# Patient Record
Sex: Female | Born: 1955 | Race: White | Hispanic: No | Marital: Married | Smoking: Never smoker
Health system: Southern US, Community
[De-identification: ages and names within clinical notes are randomized; demographics above are authoritative.]

## PROBLEM LIST (undated history)

## (undated) DIAGNOSIS — M1990 Unspecified osteoarthritis, unspecified site: Secondary | ICD-10-CM

## (undated) DIAGNOSIS — E785 Hyperlipidemia, unspecified: Secondary | ICD-10-CM

## (undated) DIAGNOSIS — J189 Pneumonia, unspecified organism: Secondary | ICD-10-CM

## (undated) DIAGNOSIS — K219 Gastro-esophageal reflux disease without esophagitis: Secondary | ICD-10-CM

## (undated) HISTORY — PX: REFRACTIVE SURGERY: SHX103

## (undated) HISTORY — PX: HAND SURGERY: SHX662

## (undated) HISTORY — PX: SLEEVE GASTROPLASTY: SHX1101

## (undated) HISTORY — PX: KNEE ARTHROSCOPY: SUR90

## (undated) HISTORY — PX: ABDOMINAL HYSTERECTOMY: SHX81

## (undated) HISTORY — PX: COLONOSCOPY: SHX174

## (undated) HISTORY — PX: TONSILLECTOMY: SUR1361

## (undated) HISTORY — PX: ESOPHAGOGASTRODUODENOSCOPY ENDOSCOPY: SHX5814

## (undated) HISTORY — PX: CHOLECYSTECTOMY: SHX55

---

## 2015-09-20 HISTORY — PX: REDUCTION MAMMAPLASTY: SUR839

## 2019-04-25 ENCOUNTER — Other Ambulatory Visit: Payer: Self-pay | Admitting: Family Medicine

## 2019-04-25 DIAGNOSIS — Z1231 Encounter for screening mammogram for malignant neoplasm of breast: Secondary | ICD-10-CM

## 2019-06-19 ENCOUNTER — Ambulatory Visit
Admission: RE | Admit: 2019-06-19 | Discharge: 2019-06-19 | Disposition: A | Discharge status: Home / Self Care | Payer: Federal, State, Local not specified - PPO | Source: Ambulatory Visit | Attending: Family Medicine | Admitting: Family Medicine

## 2019-06-19 ENCOUNTER — Other Ambulatory Visit: Payer: Self-pay

## 2019-06-19 DIAGNOSIS — Z1231 Encounter for screening mammogram for malignant neoplasm of breast: Secondary | ICD-10-CM

## 2020-01-16 ENCOUNTER — Emergency Department (HOSPITAL_COMMUNITY)
Admission: EM | Admit: 2020-01-16 | Discharge: 2020-01-16 | Disposition: A | Discharge status: Home / Self Care | Payer: Federal, State, Local not specified - PPO | Attending: Emergency Medicine | Admitting: Emergency Medicine

## 2020-01-16 ENCOUNTER — Other Ambulatory Visit: Payer: Self-pay

## 2020-01-16 ENCOUNTER — Emergency Department (HOSPITAL_COMMUNITY): Payer: Federal, State, Local not specified - PPO

## 2020-01-16 ENCOUNTER — Encounter (HOSPITAL_COMMUNITY): Payer: Self-pay | Admitting: Emergency Medicine

## 2020-01-16 DIAGNOSIS — R1031 Right lower quadrant pain: Secondary | ICD-10-CM | POA: Insufficient documentation

## 2020-01-16 DIAGNOSIS — R109 Unspecified abdominal pain: Secondary | ICD-10-CM

## 2020-01-16 LAB — COMPREHENSIVE METABOLIC PANEL
ALT: 38 U/L (ref 0–44)
AST: 35 U/L (ref 15–41)
Albumin: 4.2 g/dL (ref 3.5–5.0)
Alkaline Phosphatase: 59 U/L (ref 38–126)
Anion gap: 9 (ref 5–15)
BUN: 14 mg/dL (ref 8–23)
CO2: 25 mmol/L (ref 22–32)
Calcium: 9.2 mg/dL (ref 8.9–10.3)
Chloride: 105 mmol/L (ref 98–111)
Creatinine, Ser: 0.81 mg/dL (ref 0.44–1.00)
GFR calc Af Amer: >60 mL/min (ref >60)
GFR calc non Af Amer: >60 mL/min (ref >60)
Glucose, Bld: 94 mg/dL (ref 70–99)
Potassium: 3.7 mmol/L (ref 3.5–5.1)
Sodium: 139 mmol/L (ref 135–145)
Total Bilirubin: 0.4 mg/dL (ref 0.3–1.2)
Total Protein: 6.8 g/dL (ref 6.5–8.1)

## 2020-01-16 LAB — URINALYSIS, ROUTINE W REFLEX MICROSCOPIC
Bilirubin Urine: NEGATIVE
Glucose, UA: NEGATIVE mg/dL
Hgb urine dipstick: NEGATIVE
Ketones, ur: NEGATIVE mg/dL
Leukocytes,Ua: NEGATIVE
Nitrite: NEGATIVE
Protein, ur: NEGATIVE mg/dL
Specific Gravity, Urine: 1.019 (ref 1.005–1.030)
pH: 6 (ref 5.0–8.0)

## 2020-01-16 LAB — CBC WITH DIFFERENTIAL/PLATELET
Abs Immature Granulocytes: 0.04 10*3/uL (ref 0.00–0.07)
Basophils Absolute: 0.1 10*3/uL (ref 0.0–0.1)
Basophils Relative: 1 %
Eosinophils Absolute: 0.2 10*3/uL (ref 0.0–0.5)
Eosinophils Relative: 3 %
HCT: 41.7 % (ref 36.0–46.0)
Hemoglobin: 13.2 g/dL (ref 12.0–15.0)
Immature Granulocytes: 1 %
Lymphocytes Relative: 33 %
Lymphs Abs: 2.3 10*3/uL (ref 0.7–4.0)
MCH: 32.6 pg (ref 26.0–34.0)
MCHC: 31.7 g/dL (ref 30.0–36.0)
MCV: 103 fL — ABNORMAL HIGH (ref 80.0–100.0)
Monocytes Absolute: 0.5 10*3/uL (ref 0.1–1.0)
Monocytes Relative: 7 %
Neutro Abs: 3.9 10*3/uL (ref 1.7–7.7)
Neutrophils Relative %: 55 %
Platelets: 354 10*3/uL (ref 150–400)
RBC: 4.05 MIL/uL (ref 3.87–5.11)
RDW: 13.7 % (ref 11.5–15.5)
WBC: 7 10*3/uL (ref 4.0–10.5)
nRBC: 0 % (ref 0.0–0.2)

## 2020-01-16 LAB — PREGNANCY, URINE: Preg Test, Ur: NEGATIVE

## 2020-01-16 LAB — LIPASE, BLOOD: Lipase: 34 U/L (ref 11–51)

## 2020-01-16 MED ORDER — ONDANSETRON HCL 4 MG/2ML IJ SOLN
4.0000 mg | Freq: Once | INTRAMUSCULAR | Status: AC
  Administered 2020-01-16: 07:00:00 4 mg via INTRAVENOUS
  Filled 2020-01-16: qty 2

## 2020-01-16 MED ORDER — MORPHINE SULFATE (PF) 4 MG/ML IV SOLN
4.0000 mg | Freq: Once | INTRAVENOUS | Status: AC
  Administered 2020-01-16: 07:00:00 4 mg via INTRAVENOUS
  Filled 2020-01-16: qty 1

## 2020-01-16 NOTE — ED Provider Notes (Signed)
[facility] EMERGENCY DEPARTMENT Provider Note   CSN: [id number] Arrival date & time: 01/16/20  5643     History Chief Complaint  Patient presents with  . Flank Pain    Meghan Harrison is a 64 y.o. female  HPI Patient is a 65 year old female with a past medical history of hyperlipidemia on statin well-controlled, past surgical history of total hysterectomy with bilateral salpingectomy 2004, gastric sleeve 2007--no complications from either surgery.  Patient presents today for right-sided flank and right lower quadrant pain that is severe, sharp and stabbing intermittent pain with no associated nausea vomiting diarrhea fevers chills or urinary symptoms.  Patient states that she once had an ovarian cyst rupture and states that this pain feels similar.  Patient states that she was feeling completely well when she woke up at 3 AM this morning but at approximately 4 AM when she went to get her coffee she had abrupt onset of his pain.  She has no history of kidney stones and states that she drinks water regularly.  Denies any changes in her diet.  States that she has not taken any medications yet for her symptoms.  Patient denies any history of blood clots.  Denies any shortness of breath.  No concern for sexually transmitted disease, no vaginal discharge, itching or vaginal pain.  She denies any chest pain or shortness of breath.  She states that this time her pain has improved to 3/10 from 8/10 that was earlier.    History reviewed. No pertinent past medical history.  There are no problems to display for this patient.   Past Surgical History:  Procedure Laterality Date  . REDUCTION MAMMAPLASTY Bilateral 09/2015   breast lift      OB History   No obstetric history on file.     No family history on file.  Social History   Tobacco Use  . Smoking status: Never Smoker  . Smokeless tobacco: Never Used  Substance Use Topics  . Alcohol use: Not on file  .  Drug use: Not on file    Home Medications Prior to Admission medications   Not on File    Allergies    Percocet [oxycodone-acetaminophen] and Sulfa antibiotics  Review of Systems   Review of Systems  Constitutional: Negative for chills and fever.  HENT: Negative for congestion.   Eyes: Negative for pain.  Respiratory: Negative for cough and shortness of breath.   Cardiovascular: Negative for chest pain and leg swelling.  Gastrointestinal: Positive for abdominal pain. Negative for vomiting.  Genitourinary: Positive for flank pain. Negative for dysuria.  Musculoskeletal: Negative for myalgias.  Skin: Negative for rash.  Neurological: Negative for dizziness and headaches.    Physical Exam Updated Vital Signs BP (!) 119/55   Pulse 75   Temp 98.6 F (37 C) (Oral)   Resp 18   SpO2 96%   Physical Exam Vitals and nursing note reviewed.  Constitutional:      General: She is not in acute distress. HENT:     Head: Normocephalic and atraumatic.     Nose: Nose normal.     Mouth/Throat:     Mouth: Mucous membranes are moist.     Pharynx: No oropharyngeal exudate.  Eyes:     General: No scleral icterus. Cardiovascular:     Rate and Rhythm: Normal rate and regular rhythm.     Pulses: Normal pulses.     Heart sounds: Normal heart sounds.  Pulmonary:     Effort: Pulmonary  effort is normal. No respiratory distress.     Breath sounds: No wheezing.  Abdominal:     General: Bowel sounds are normal. There is no distension.     Palpations: Abdomen is soft. There is no mass.     Tenderness: There is abdominal tenderness (RLQ). There is no right CVA tenderness, left CVA tenderness or rebound.     Comments: Mild right lower quadrant tenderness to palpation.  No rebound or guarding.  Musculoskeletal:     Cervical back: Normal range of motion.     Right lower leg: No edema.     Left lower leg: No edema.  Skin:    General: Skin is warm and dry.     Capillary Refill: Capillary refill  takes less than 2 seconds.  Neurological:     Mental Status: She is alert. Mental status is at baseline.  Psychiatric:        Mood and Affect: Mood normal.        Behavior: Behavior normal.     ED Results / Procedures / Treatments   Labs (all labs ordered are listed, but only abnormal results are displayed) Labs Reviewed  CBC WITH DIFFERENTIAL/PLATELET - Abnormal; Notable for the following components:      Result Value   MCV 103.0 (*)    All other components within normal limits  URINALYSIS, ROUTINE W REFLEX MICROSCOPIC  COMPREHENSIVE METABOLIC PANEL  LIPASE, BLOOD  PREGNANCY, URINE    EKG None  Radiology CT Renal Stone Study  Result Date: 01/16/2020 CLINICAL DATA:  Flank pain with kidney stone suspected EXAM: CT ABDOMEN AND PELVIS WITHOUT CONTRAST TECHNIQUE: Multidetector CT imaging of the abdomen and pelvis was performed following the standard protocol without IV contrast. COMPARISON:  None. FINDINGS: Lower chest: Postoperative stomach with small hiatal hernia. Mild scarring in the right lower lobe adjacent osteophytes. Hepatobiliary: No focal liver abnormality.Cholecystectomy. Pancreas: Unremarkable. Spleen: Unremarkable. Adrenals/Urinary Tract: Negative adrenals. No hydronephrosis or ureteral stone. Small calcification in the interpolar left kidney which appears parenchymal. Unremarkable bladder. Stomach/Bowel: No obstruction. No appendicitis. Mild left colonic diverticulosis. Vascular/Lymphatic: No acute vascular abnormality. No mass or adenopathy. Reproductive:Hysterectomy. Other: No ascites or pneumoperitoneum. Minimal bulging of fat to the left inguinal canal. Musculoskeletal: No acute abnormalities. Lumbar facet arthropathy with L4-5 anterolisthesis. 1 IMPRESSION: 1. No acute finding.  No hydronephrosis or ureteral calculus. 2. Postoperative stomach with small hiatal hernia. Electronically Signed   By: Monte Fantasia M.D.   On: 01/16/2020 08:05    Procedures Procedures  (including critical care time)  Medications Ordered in ED Medications  ondansetron (ZOFRAN) injection 4 mg (4 mg Intravenous Given 01/16/20 0707)  morphine 4 MG/ML injection 4 mg (4 mg Intravenous Given 01/16/20 7371)    ED Course  I have reviewed the triage vital signs and the nursing notes.  Pertinent labs & imaging results that were available during my care of the patient were reviewed by me and considered in my medical decision making (see chart for details).    MDM Rules/Calculators/A&P                      Right lower quadrant abdominal pain.  Concern for appendicitis versus ovarian torsion versus kidney stone.  Suspect kidney stone is most likely will order CT noncontrast.  Patient is high enough BMI that she will likely have good visualization of appendix without contrast.  CMP and CBC unremarkable.  Lipase within normal limits.  Urine within normal months.  8:42 AM --  discussed CT read with Dr. Grace Isaac of radiology who states that he sees no residual ovarian tissue suspect that likely both ovaries were removed during hysterectomy.  9:00 AM on reassessment of patient she is now without any abdominal pain.  States that she did briefly have some epigastric pain that went away within several minutes.  Denies any nausea or discomfort at this time.  She has no tenderness to palpation of right lower quadrant.  CT imaging of abdomen pelvis is without abnormality.  Appendix was well visualized even with the noncontrast CT scan shows no evidence of stranding or inflammation. Discussed with patient likely etiology being that she passed a very small kidney stone that was either missed or was passed by the time of CT imaging.  Patient given strict return precautions.  The medical records were personally reviewed by myself. I personally reviewed all lab results and interpreted all imaging studies and either concurred with their official read or contacted radiology for clarification.   This  patient appears reasonably screened and I doubt any other medical condition requiring further workup, evaluation, or treatment in the ED at this time prior to discharge.   Patient's vitals are WNL apart from vital sign abnormalities discussed above, patient is in NAD, and able to ambulate in the ED at their baseline and able to tolerate PO.  Pain has been managed or a plan has been made for home management and has no complaints prior to discharge. Patient is comfortable with above plan and for discharge at this time. All questions were answered prior to disposition. Results from the ER workup discussed with the patient face to face and all questions answered to the best of my ability. The patient is safe for discharge with strict return precautions. Patient appears safe for discharge with appropriate follow-up. Conveyed my impression with the patient and they voiced understanding and are agreeable to plan.   An After Visit Summary was printed and given to the patient.  Portions of this note were generated with Scientist, clinical (histocompatibility and immunogenetics). Dictation errors may occur despite best attempts at proofreading.   I discussed this case with my attending physician who cosigned this note including patient's presenting symptoms, physical exam, and planned diagnostics and interventions. Attending physician stated agreement with plan or made changes to plan which were implemented.   Final Clinical Impression(s) / ED Diagnoses Final diagnoses:  Right flank pain    Rx / DC Orders ED Discharge Orders    None       Gailen Shelter, [place] 01/16/20 0900    Nira Conn, MD 01/17/20 [phone]

## 2020-01-16 NOTE — ED Notes (Signed)
Pt returned from CT °

## 2020-01-16 NOTE — ED Notes (Signed)
PA at bedside.

## 2020-01-16 NOTE — Discharge Instructions (Signed)
I recommend following up with your primary care doctor in the next week or 2 for reevaluation.  If you feel any new or concerning symptoms please return to ED.  However, I suspect that he experienced a recently passed kidney stone today.  I do not suspect he will have any further issues from this.  If you have worsening right lower quadrant abdominal pain and fevers or other new or concerning symptoms please return to ED.   I also recommend ibuprofen for pain if you have recurrence of symptoms and are unable to immediately get to ED.

## 2020-01-16 NOTE — ED Triage Notes (Signed)
Pt presents to Ed from home POV. Pt reports sudden onset of R flank pain that radiates toward R back. Pt denies dysuria.

## 2020-05-05 ENCOUNTER — Other Ambulatory Visit: Payer: Self-pay | Admitting: Family Medicine

## 2020-05-05 DIAGNOSIS — Z1231 Encounter for screening mammogram for malignant neoplasm of breast: Secondary | ICD-10-CM

## 2020-06-19 ENCOUNTER — Ambulatory Visit: Payer: Federal, State, Local not specified - PPO

## 2020-08-20 ENCOUNTER — Ambulatory Visit: Payer: Federal, State, Local not specified - PPO

## 2020-08-27 ENCOUNTER — Ambulatory Visit
Admission: RE | Admit: 2020-08-27 | Discharge: 2020-08-27 | Disposition: A | Discharge status: Home / Self Care | Payer: Federal, State, Local not specified - PPO | Source: Ambulatory Visit | Attending: Family Medicine | Admitting: Family Medicine

## 2020-08-27 ENCOUNTER — Other Ambulatory Visit: Payer: Self-pay

## 2020-08-27 DIAGNOSIS — Z1231 Encounter for screening mammogram for malignant neoplasm of breast: Secondary | ICD-10-CM

## 2020-09-08 ENCOUNTER — Other Ambulatory Visit: Payer: Self-pay | Admitting: Family Medicine

## 2020-09-08 DIAGNOSIS — E2839 Other primary ovarian failure: Secondary | ICD-10-CM

## 2020-11-03 ENCOUNTER — Other Ambulatory Visit: Payer: Self-pay

## 2020-11-03 ENCOUNTER — Ambulatory Visit (INDEPENDENT_AMBULATORY_CARE_PROVIDER_SITE_OTHER): Payer: Federal, State, Local not specified - PPO

## 2020-11-03 ENCOUNTER — Ambulatory Visit: Payer: Federal, State, Local not specified - PPO | Admitting: Podiatry

## 2020-11-03 DIAGNOSIS — M7662 Achilles tendinitis, left leg: Secondary | ICD-10-CM | POA: Diagnosis not present

## 2020-11-03 DIAGNOSIS — M7732 Calcaneal spur, left foot: Secondary | ICD-10-CM

## 2020-11-03 DIAGNOSIS — S9002XA Contusion of left ankle, initial encounter: Secondary | ICD-10-CM

## 2020-11-03 MED ORDER — MELOXICAM 15 MG PO TABS: 15.0000 mg | ORAL_TABLET | Freq: Every day | ORAL | 1 refills | Status: DC

## 2020-11-03 NOTE — Progress Notes (Signed)
   HPI: 64 y.o. female presenting today as a new patient referral from Dr. Elijah Birk, local podiatrist for evaluation of left posterior heel pain.  Patient has been diagnosed with a posterior heel spur and Achilles tendinitis that has been going on for approximately 2 years now despite conservative treatment modalities.  Patient has had steroidal injections, immobilization, orthotics with no relief of symptoms.  She also takes oral meloxicam as needed for the pain.  She presents today for surgical consultation regarding the posterior heel pain    No past medical history on file.    Physical Exam: General: The patient is alert and oriented x3 in no acute distress.  Dermatology: Skin is warm, dry and supple bilateral lower extremities. Negative for open lesions or macerations.  Vascular: Palpable pedal pulses bilaterally. No edema or erythema noted. Capillary refill within normal limits.  Neurological: Epicritic and protective threshold grossly intact bilaterally.   Musculoskeletal Exam: Pain on palpation noted to the posterior tubercle of the left calcaneus at the insertion of the Achilles tendon consistent with retrocalcaneal bursitis. Range of motion within normal limits. Muscle strength 5/5 in all muscle groups bilateral lower extremities.  Radiographic Exam:  Posterior calcaneal spur noted to the respective calcaneus on lateral view. No fracture or dislocation noted. Normal osseous mineralization noted.     Assessment: 1. Insertional Achilles tendinitis left 2. Retrocalcaneal bursitis   Plan of Care:  1. Patient was evaluated. Radiographs were reviewed today. 2. Today we discussed the conservative versus surgical management of the presenting pathology. The patient opts for surgical management. All possible complications and details of the procedure were explained. All patient questions were answered. No guarantees were expressed or implied. 3. Authorization for surgery was initiated today.  Surgery will consist of retrocalcaneal exostectomy with repair of Achilles tendon left.  Possible application of amniotic tissue graft. 4.  Refill prescription for meloxicam 15 mg daily 5.  Return to clinic 1 week postop  *Retired.  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 Felecia Harrison, DPM    [address]                                        [place], [place] [address]                Office [phone]  Fax [phone]

## 2020-11-19 ENCOUNTER — Telehealth: Payer: Self-pay

## 2020-11-19 NOTE — Telephone Encounter (Signed)
Meghan Harrison called to cancel her surgery with Dr. Logan Bores on 12/25/2020. She stated she is having issues with her right  Knee and needs to get it fixed first before taking care of her left foot. She stated she will call me back to reschedule her surgery. Notified Cynthia with [facility] and Dr. Logan Bores

## 2020-12-17 ENCOUNTER — Other Ambulatory Visit: Payer: Federal, State, Local not specified - PPO

## 2020-12-25 ENCOUNTER — Other Ambulatory Visit: Payer: Federal, State, Local not specified - PPO

## 2020-12-31 ENCOUNTER — Encounter: Payer: Federal, State, Local not specified - PPO | Admitting: Podiatry

## 2021-01-07 ENCOUNTER — Encounter: Payer: Federal, State, Local not specified - PPO | Admitting: Podiatry

## 2021-01-26 ENCOUNTER — Encounter: Payer: Federal, State, Local not specified - PPO | Admitting: Podiatry

## 2021-03-11 ENCOUNTER — Ambulatory Visit
Admission: RE | Admit: 2021-03-11 | Discharge: 2021-03-11 | Disposition: A | Discharge status: Home / Self Care | Payer: Federal, State, Local not specified - PPO | Source: Ambulatory Visit | Attending: Family Medicine | Admitting: Family Medicine

## 2021-03-11 ENCOUNTER — Other Ambulatory Visit: Payer: Self-pay

## 2021-03-11 DIAGNOSIS — E2839 Other primary ovarian failure: Secondary | ICD-10-CM

## 2021-03-13 ENCOUNTER — Other Ambulatory Visit: Payer: Federal, State, Local not specified - PPO

## 2021-10-01 ENCOUNTER — Other Ambulatory Visit: Payer: Self-pay | Admitting: Family Medicine

## 2021-10-01 DIAGNOSIS — Z1231 Encounter for screening mammogram for malignant neoplasm of breast: Secondary | ICD-10-CM

## 2021-11-04 ENCOUNTER — Ambulatory Visit
Admission: RE | Admit: 2021-11-04 | Discharge: 2021-11-04 | Disposition: A | Discharge status: Home / Self Care | Payer: Federal, State, Local not specified - PPO | Source: Ambulatory Visit | Attending: Family Medicine | Admitting: Family Medicine

## 2021-11-04 ENCOUNTER — Other Ambulatory Visit: Payer: Self-pay

## 2021-11-04 DIAGNOSIS — Z1231 Encounter for screening mammogram for malignant neoplasm of breast: Secondary | ICD-10-CM

## 2022-04-15 ENCOUNTER — Encounter (INDEPENDENT_AMBULATORY_CARE_PROVIDER_SITE_OTHER): Payer: Self-pay

## 2022-04-22 ENCOUNTER — Ambulatory Visit (INDEPENDENT_AMBULATORY_CARE_PROVIDER_SITE_OTHER): Payer: Federal, State, Local not specified - PPO | Admitting: Ophthalmology

## 2022-04-22 ENCOUNTER — Encounter (INDEPENDENT_AMBULATORY_CARE_PROVIDER_SITE_OTHER): Payer: Self-pay | Admitting: Ophthalmology

## 2022-04-22 ENCOUNTER — Encounter (INDEPENDENT_AMBULATORY_CARE_PROVIDER_SITE_OTHER): Payer: Federal, State, Local not specified - PPO | Admitting: Ophthalmology

## 2022-04-22 DIAGNOSIS — H43812 Vitreous degeneration, left eye: Secondary | ICD-10-CM | POA: Diagnosis not present

## 2022-04-22 DIAGNOSIS — H2513 Age-related nuclear cataract, bilateral: Secondary | ICD-10-CM | POA: Diagnosis not present

## 2022-04-22 DIAGNOSIS — H35371 Puckering of macula, right eye: Secondary | ICD-10-CM

## 2022-04-22 NOTE — Assessment & Plan Note (Addendum)
Mild OU not visually significant follow-up Dr. Santiago Bumpers, [facility] ?

## 2022-04-22 NOTE — Progress Notes (Signed)
? ? ?04/22/2022 ? ?  ? ?CHIEF COMPLAINT ?Patient presents for  ?Chief Complaint  ?Patient presents with  ? Retina Evaluation  ? ? ? ? ?HISTORY OF PRESENT ILLNESS: ?Meghan Harrison is a 67 y.o. female who presents to the clinic today for:  ? ?HPI   ? ? Retina Evaluation   ? ?      ? Laterality: left eye  ? Associated Symptoms: Flashes and Floaters.  Negative for Distortion, Blind Spot, Pain, Redness, Photophobia, Glare, Trauma, Scalp Tenderness, Jaw Claudication, Shoulder/Hip pain, Fever, Weight Loss and Fatigue  ? Treatments tried: no treatments  ? ?  ?  ? ? Comments   ?NP Secondary vitreoretinal degeneration; bilateral; eval. ?Pt reports small floaters. ?Pt stated one big "yarn" in OS and flashes of light right in the middle but she never saw them again. Symptoms started about 2 weeks ago. Pt has a stye in the right eye over a week ago and on AUGMENTIN. ?Pt reports at the age of 39 had a swollen optic nerve ? ? ? ? ?  ?  ?Last edited by Silvestre Moment on 04/22/2022  9:36 AM.  ?  ? ? ?Referring physician: ?Nat Christen, MD ?[address] ?[place],  [place] [address] ? ?HISTORICAL INFORMATION:  ? ?Selected notes from the [facility] ?  ?   ? ?CURRENT MEDICATIONS: ?No current outpatient medications on file. (Ophthalmic Drugs)  ? ?No current facility-administered medications for this visit. (Ophthalmic Drugs)  ? ?Current Outpatient Medications (Other)  ?Medication Sig  ? albuterol (VENTOLIN HFA) 108 (90 Base) MCG/ACT inhaler Inhale into the lungs.  ? ascorbic acid (VITAMIN C) 1000 MG tablet Take by mouth.  ? atorvastatin (LIPITOR) 10 MG tablet Take by mouth.  ? Biotin 1 MG CAPS Take by mouth.  ? calcium carbonate (OSCAL) 1500 (600 Ca) MG TABS tablet Take 1 tablet by mouth daily.  ? Cholestyramine POWD   ? cyanocobalamin 100 MCG tablet Take by mouth.  ? diclofenac Sodium (VOLTAREN) 1 % GEL diclofenac 1 % topical gel ? APPLY 2 GRAMS TO THE AFFECTED AREA 4 TIMES PER DAY  ? diphenhydrAMINE (SOMINEX) 25 MG tablet Take by  mouth.  ? ferrous sulfate 325 (65 FE) MG tablet Take by mouth.  ? Glucosamine HCl 1500 MG TABS Take by mouth.  ? Glucosamine-Chondroit-Vit C-Mn (GLUCOSAMINE 1500 COMPLEX PO)   ? Magnesium 100 MG CAPS   ? meloxicam (MOBIC) 15 MG tablet Take 1 tablet (15 mg total) by mouth daily.  ? Multiple Vitamin (MULTI-VITAMIN DAILY) TABS   ? omeprazole (PRILOSEC) 40 MG capsule   ? Potassium 99 MG TABS   ? Probiotic Product (PROBIOTIC-10 ULTIMATE) CAPS   ? Specialty Vitamins Products (MAGNESIUM, AMINO ACID CHELATE,) 133 MG tablet Take 1 tablet by mouth 3 (three) times daily.  ? SUPREP BOWEL PREP KIT 17.5-3.13-1.6 GM/177ML SOLN Take by mouth.  ? ?No current facility-administered medications for this visit. (Other)  ? ? ? ? ?REVIEW OF SYSTEMS: ?ROS   ?Negative for: Constitutional, Gastrointestinal, Neurological, Skin, Genitourinary, Musculoskeletal, HENT, Endocrine, Cardiovascular, Eyes, Respiratory, Psychiatric, Allergic/Imm, Heme/Lymph ?Last edited by Silvestre Moment on 04/22/2022  9:29 AM.  ?  ? ? ? ?ALLERGIES ?Allergies  ?Allergen Reactions  ? Oxycodone-Acetaminophen Other (See Comments) and Itching  ?  hallucinations ?hallucinations ?  ? Sulfa Antibiotics   ?  abd distress  ? ? ?PAST MEDICAL HISTORY ?No past medical history on file. ?Past Surgical History:  ?Procedure Laterality Date  ? REDUCTION MAMMAPLASTY Bilateral 09/2015  ?  breast lift   ? ? ?FAMILY HISTORY ?No family history on file. ? ?SOCIAL HISTORY ?Social History  ? ?Tobacco Use  ? Smoking status: Never  ? Smokeless tobacco: Never  ? ?  ? ?  ? ?OPHTHALMIC EXAM: ? ?Base Eye Exam   ? ? Visual Acuity (ETDRS)   ? ?   Right Left  ? Dist cc 20/30 +1 20/30 -1  ? ? Correction: Glasses  ? ?  ?  ? ? Tonometry (Tonopen, 9:35 AM)   ? ?   Right Left  ? Pressure 10 9  ? ?  ?  ? ? Pupils   ? ?   Pupils APD  ? Right PERRL None  ? Left PERRL None  ? ?  ?  ? ? Visual Fields   ? ?   Left Right  ?  Full Full  ? ?  ?  ? ? Extraocular Movement   ? ?   Right Left  ?  Full Full  ? ?  ?  ? ?  Neuro/Psych   ? ? Oriented x3: Yes  ? ?  ?  ? ? Dilation   ? ? Both eyes: 1.0% Mydriacyl, 2.5% Phenylephrine @ 9:35 AM  ? ?  ?  ? ?  ? ?Slit Lamp and Fundus Exam   ? ? Slit Lamp Exam   ? ?   Right Left  ? Lids/Lashes Normal Normal  ? Conjunctiva/Sclera White and quiet White and quiet  ? Cornea Clear Clear  ? Anterior Chamber Deep and quiet Deep and quiet  ? Iris Round and reactive Round and reactive  ? Lens 1+ Nuclear sclerosis 1+ Nuclear sclerosis  ? Anterior Vitreous Normal Normal  ? ?  ?  ? ? Fundus Exam   ? ?   Right Left  ? Posterior Vitreous Normal Posterior vitreous detachment  ? Disc Normal Normal  ? C/D Ratio 0.0 0.0  ? Macula Epiretinal membrane, shiny ILM reflex, no topographic distortion Normal  ? Vessels Normal Normal  ? Periphery No holes or tears No peripheral retinal holes or tears to the ora serrata, 25 and 20 D exam  ? ?  ?  ? ?  ? ? ?IMAGING AND PROCEDURES  ?Imaging and Procedures for 04/22/22 ? ?OCT, Retina - OU - Both Eyes   ? ?   ?Right Eye ?Quality was good. Scan locations included subfoveal. Central Foveal Thickness: 282. Progression has no prior data. Findings include epiretinal membrane.  ? ?Left Eye ?Quality was good. Scan locations included subfoveal. Central Foveal Thickness: 252. Progression has no prior data.  ? ?Notes ?Incidental PVD OS noted ? ?Nondistorted ERM OD  ? ?  ? ?Color Fundus Photography Optos - OU - Both Eyes   ? ?   ?Right Eye ?Progression has no prior data. Disc findings include normal observations. Macula : epiretinal membrane. Vessels : normal observations. Periphery : normal observations.  ? ?Left Eye ?Progression has no prior data. Disc findings include normal observations. Macula : normal observations. Vessels : normal observations. Periphery : normal observations.  ? ?  ? ? ?  ?  ? ?  ?ASSESSMENT/PLAN: ? ?Nuclear sclerotic cataract of both eyes ?Mild OU not visually significant follow-up Dr. Gwynn Burly, [facility] ? ?Epiretinal membrane, right eye ?None  distorting epiretinal membrane seen on OCT.  No impact on acuity will continue to monitor and observe only.  ? ?  ICD-10-CM   ?1. Epiretinal membrane, right eye  H35.371 OCT, Retina -  OU - Both Eyes  ?  Color Fundus Photography Optos - OU - Both Eyes  ?  ?2. Posterior vitreous detachment of left eye  H43.812 OCT, Retina - OU - Both Eyes  ?  ?3. Nuclear sclerotic cataract of both eyes  H25.13   ?  ? ? ?1.  OS with signs symptoms of PVD but no holes or tears confirmed today on careful examination ? ?2.  OD incidental ERM, no impact on acuity follow-up here in 2 years or as needed or if reading vision begins to slowly decline or as per Dr. Suanne Marker Thurmond's recommendations  ? ?3.  Mild NSC changes OU ? ? ? ?Ophthalmic Meds Ordered this visit:  ?No orders of the defined types were placed in this encounter. ? ? ?  ? ?Return if symptoms worsen or fail to improve, for Follow-up here as per Dr. Gwynn Burly. ? ?There are no Patient Instructions on file for this visit. ? ? ?Explained the diagnoses, plan, and follow up with the patient and they expressed understanding.  Patient expressed understanding of the importance of proper follow up care.  ? ?Clent Demark. Latravion Graves M.D. ?Diseases & Surgery of the Retina and Vitreous ?[place] ?04/22/22 ? ? ? ? ?Abbreviations: ?M myopia (nearsighted); A astigmatism; H hyperopia (farsighted); P presbyopia; Mrx spectacle prescription;  CTL contact lenses; OD right eye; OS left eye; OU both eyes  XT exotropia; ET esotropia; PEK punctate epithelial keratitis; PEE punctate epithelial erosions; DES dry eye syndrome; MGD meibomian gland dysfunction; ATs artificial tears; PFAT's preservative free artificial tears; [place] nuclear sclerotic cataract; PSC posterior subcapsular cataract; ERM epi-retinal membrane; PVD posterior vitreous detachment; RD retinal detachment; DM diabetes mellitus; DR diabetic retinopathy; NPDR non-proliferative diabetic retinopathy; PDR proliferative diabetic  retinopathy; CSME clinically significant macular edema; DME diabetic macular edema; dbh dot blot hemorrhages; CWS cotton wool spot; POAG primary open angle glaucoma; C/D cup-to-disc ratio; HVF humphrey visual field; GVF go

## 2022-04-22 NOTE — Assessment & Plan Note (Signed)
None distorting epiretinal membrane seen on OCT.  No impact on acuity will continue to monitor and observe only. ?

## 2022-09-14 ENCOUNTER — Other Ambulatory Visit: Payer: Self-pay | Admitting: Family Medicine

## 2022-09-14 DIAGNOSIS — N6320 Unspecified lump in the left breast, unspecified quadrant: Secondary | ICD-10-CM

## 2022-09-27 ENCOUNTER — Ambulatory Visit
Admission: RE | Admit: 2022-09-27 | Discharge: 2022-09-27 | Disposition: A | Discharge status: Home / Self Care | Payer: Federal, State, Local not specified - PPO | Source: Ambulatory Visit | Attending: Family Medicine | Admitting: Family Medicine

## 2022-09-27 DIAGNOSIS — N6320 Unspecified lump in the left breast, unspecified quadrant: Secondary | ICD-10-CM

## 2023-04-13 IMAGING — MG MM DIGITAL SCREENING BILAT W/ TOMO AND CAD
8 series · 8 of 24 positions shown · non-contrast
Comparison: Previous exam(s).

CLINICAL DATA: Screening.

EXAM:
DIGITAL SCREENING BILATERAL MAMMOGRAM WITH TOMOSYNTHESIS AND CAD
TECHNIQUE: Bilateral screening digital craniocaudal and mediolateral oblique
mammograms were obtained. Bilateral screening digital breast
tomosynthesis was performed. The images were evaluated with
computer-aided detection.

[L MLO synth-2D]
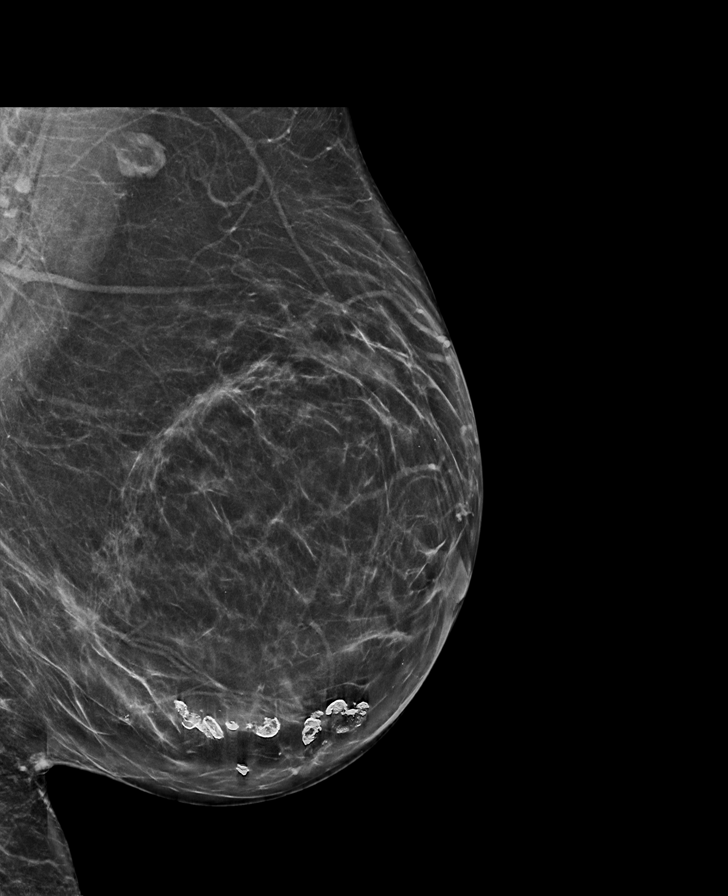

[L CC synth-2D]
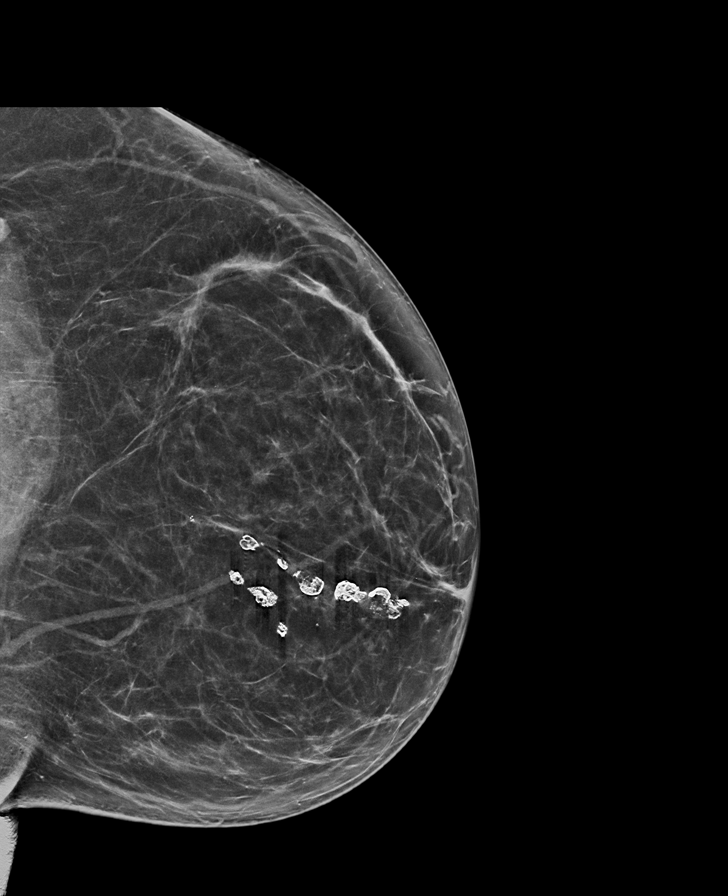

[R MLO synth-2D]
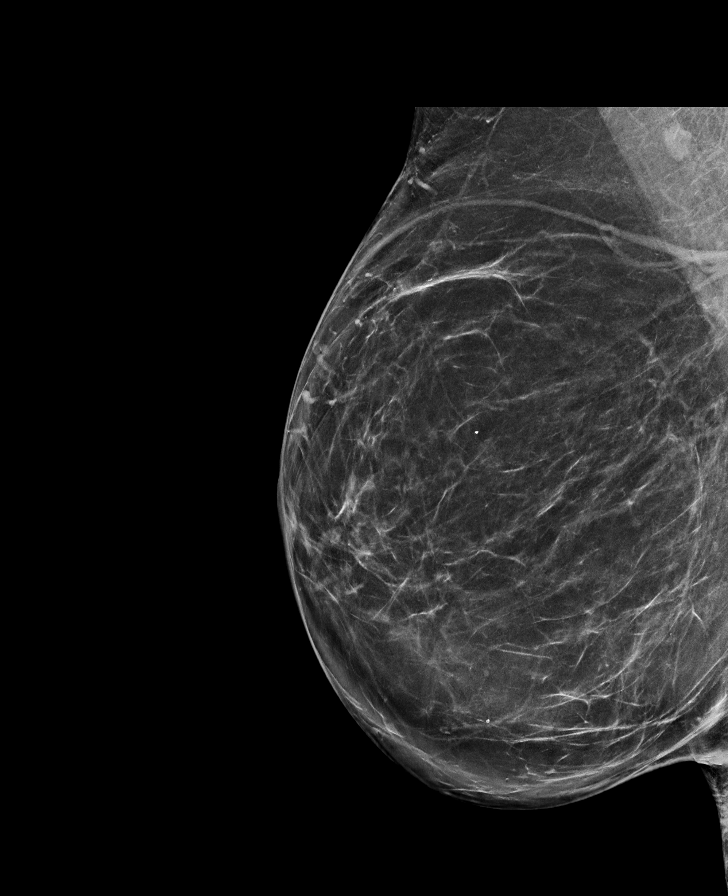

[R CC synth-2D]
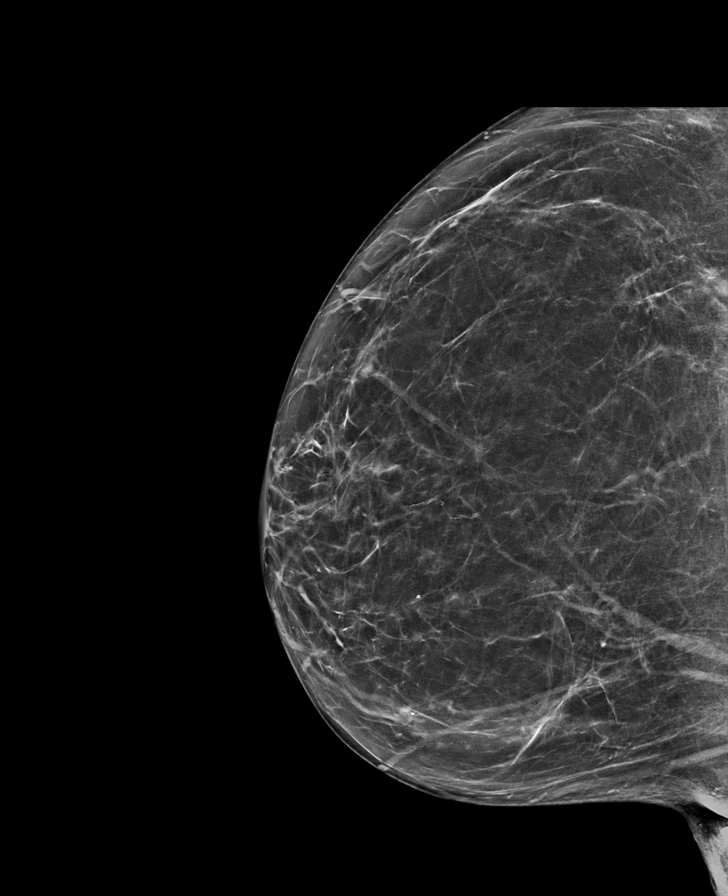

[L CC tomo · tomo slice 39/76.0]
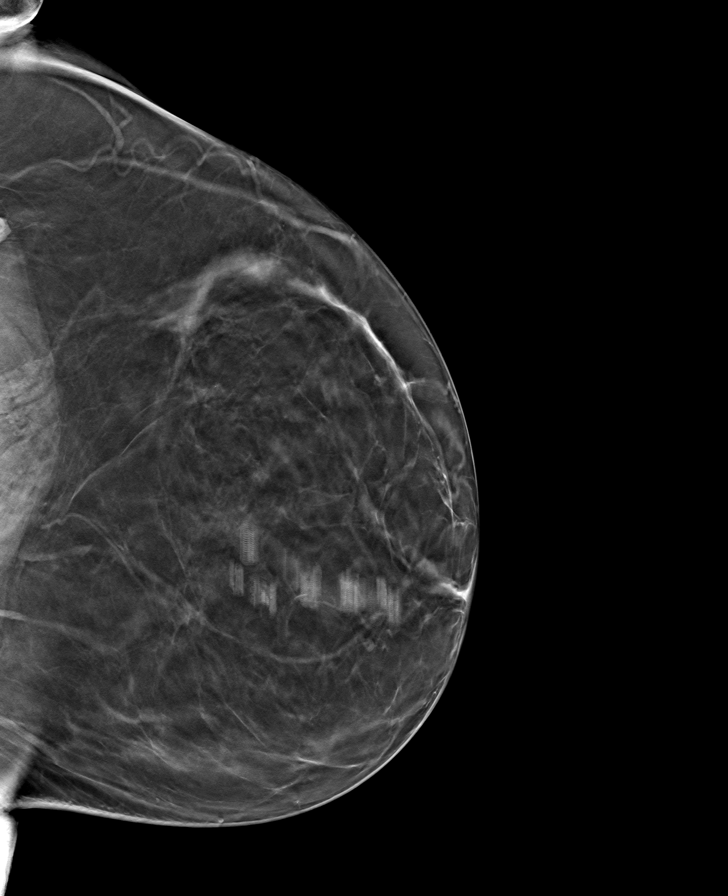

[L MLO tomo · tomo slice 39/77.0]
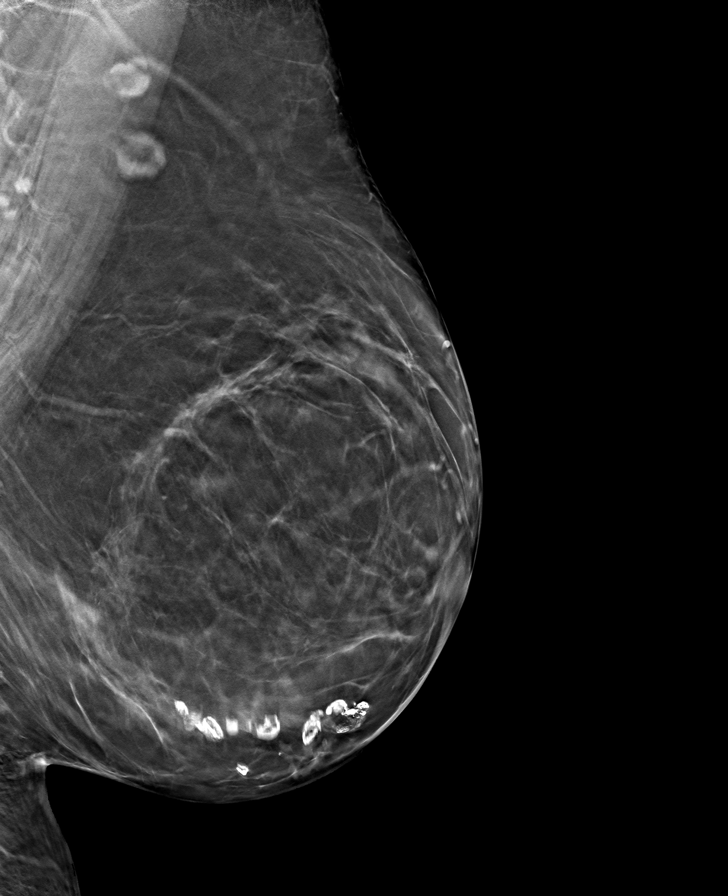

[R MLO tomo · tomo slice 41/82.0]
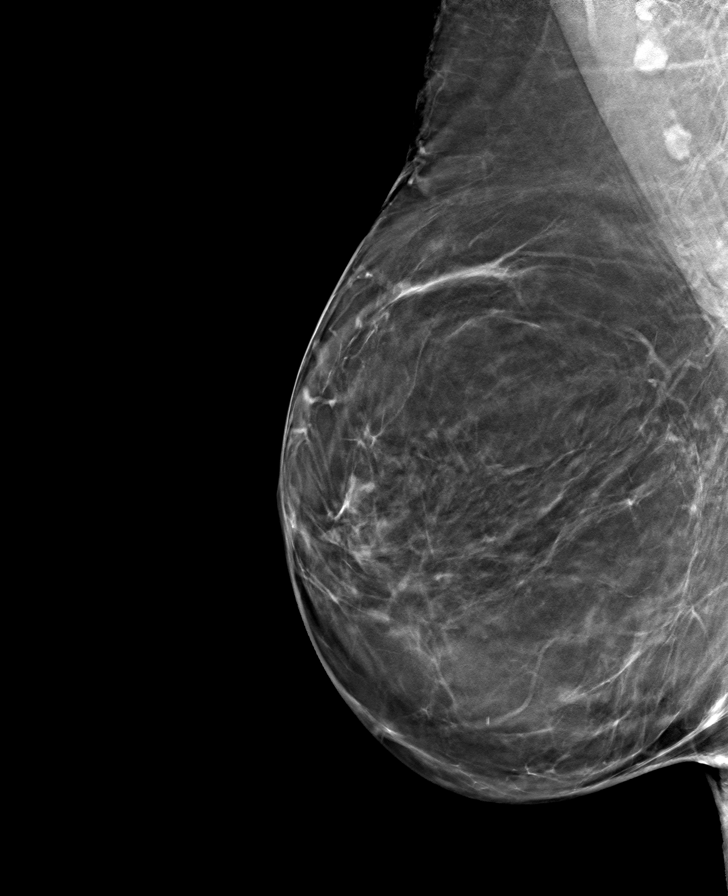

[R CC tomo · tomo slice 39/76.0]
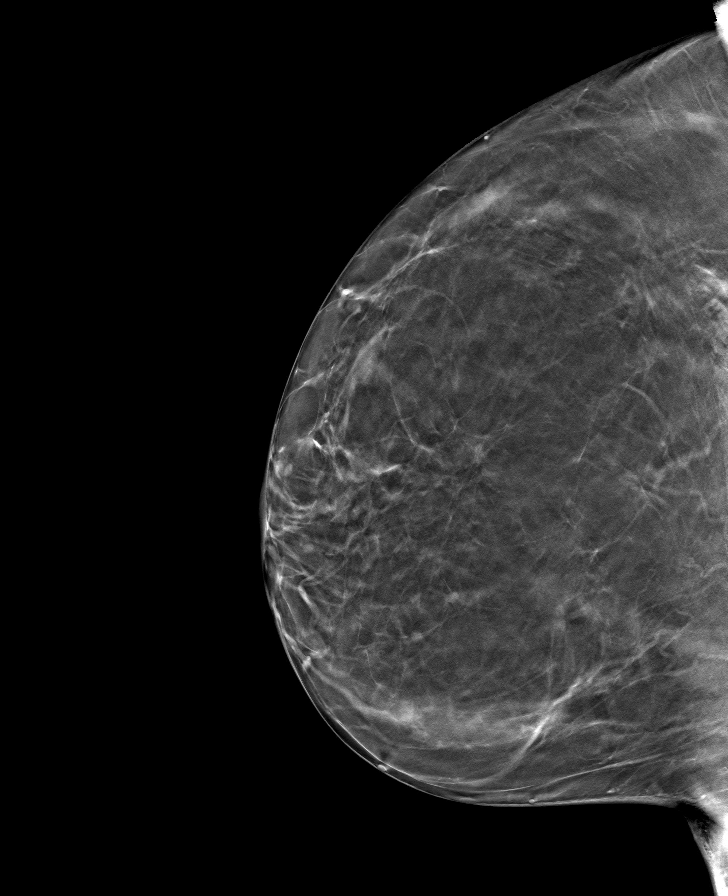

[8 of 24 positions shown; findings below may reference images not displayed]

ACR Breast Density Category b: There are scattered areas of
fibroglandular density.
FINDINGS: There are no findings suspicious for malignancy.
IMPRESSION: No mammographic evidence of malignancy. A result letter of this
screening mammogram will be mailed directly to the patient.

RECOMMENDATION:
Screening mammogram in one year. (Code:51-O-LD2)

BI-RADS CATEGORY  1: Negative.

## 2023-04-25 ENCOUNTER — Encounter (INDEPENDENT_AMBULATORY_CARE_PROVIDER_SITE_OTHER): Payer: Self-pay

## 2023-04-25 ENCOUNTER — Encounter (INDEPENDENT_AMBULATORY_CARE_PROVIDER_SITE_OTHER): Payer: Federal, State, Local not specified - PPO | Admitting: Ophthalmology

## 2023-09-22 ENCOUNTER — Other Ambulatory Visit: Payer: Self-pay | Admitting: Family Medicine

## 2023-09-22 DIAGNOSIS — E2839 Other primary ovarian failure: Secondary | ICD-10-CM

## 2023-09-22 DIAGNOSIS — Z1239 Encounter for other screening for malignant neoplasm of breast: Secondary | ICD-10-CM

## 2023-10-01 ENCOUNTER — Ambulatory Visit
Admission: RE | Admit: 2023-10-01 | Discharge: 2023-10-01 | Disposition: A | Discharge status: Home / Self Care | Payer: Medicare Other | Source: Ambulatory Visit | Attending: Family Medicine | Admitting: Family Medicine

## 2023-10-01 DIAGNOSIS — Z1239 Encounter for other screening for malignant neoplasm of breast: Secondary | ICD-10-CM

## 2024-01-24 NOTE — Patient Instructions (Signed)
DUE TO COVID-19 ONLY TWO VISITORS  (aged 66 and older)  ARE ALLOWED TO COME WITH YOU AND STAY IN THE WAITING ROOM ONLY DURING PRE OP AND PROCEDURE.   **NO VISITORS ARE ALLOWED IN THE SHORT STAY AREA OR RECOVERY ROOM!!**  IF YOU WILL BE ADMITTED INTO THE HOSPITAL YOU ARE ALLOWED ONLY FOUR SUPPORT PEOPLE DURING VISITATION HOURS ONLY (7 AM -8PM)   The support person(s) must pass our screening, gel in and out, and wear a mask at all times, including in the patient's room. Patients must also wear a mask when staff or their support person are in the room. Visitors GUEST BADGE MUST BE WORN VISIBLY  One adult visitor may remain with you overnight and MUST be in the room by 8 P.M.     Your procedure is scheduled on: 02/07/24   Report to [facility] Main Entrance    Report to admitting at : 1:15 PM   Call this number if you have problems the morning of surgery [phone]   Do not eat food :After Midnight.   After Midnight you may have the following liquids until : 12:45 PM DAY OF SURGERY  Water Black Coffee (sugar ok, NO MILK/CREAM OR CREAMERS)  Tea (sugar ok, NO MILK/CREAM OR CREAMERS) regular and decaf                             Plain Jell-O (NO RED)                                           Fruit ices (not with fruit pulp, NO RED)                                     Popsicles (NO RED)                                                                  Juice: apple, WHITE grape, WHITE cranberry Sports drinks like Gatorade (NO RED)   The day of surgery:  Drink ONE (1) Pre-Surgery Clear Ensure or G2 at : 12:45 PM the morning of surgery. Drink in one sitting. Do not sip.  This drink was given to you during your hospital  pre-op appointment visit. Nothing else to drink after completing the  Pre-Surgery Clear Ensure or G2.          If you have questions, please contact your surgeon's office.  FOLLOW ANY ADDITIONAL PRE OP INSTRUCTIONS YOU RECEIVED FROM YOUR SURGEON'S OFFICE!!!    Oral Hygiene is also important to reduce your risk of infection.                                    Remember - BRUSH YOUR TEETH THE MORNING OF SURGERY WITH YOUR REGULAR TOOTHPASTE  DENTURES WILL BE REMOVED PRIOR TO SURGERY PLEASE DO NOT APPLY "Poly grip" OR ADHESIVES!!!   Do NOT smoke after Midnight   Take these medicines the morning  of surgery with A SIP OF WATER: omeprazole.  DO NOT TAKE ANY ORAL DIABETIC MEDICATIONS DAY OF YOUR SURGERY. HOLD Wegovy after: 01/30/24  Bring CPAP mask and tubing day of surgery.                              You may not have any metal on your body including hair pins, jewelry, and body piercing             Do not wear make-up, lotions, powders, perfumes/cologne, or deodorant  Do not wear nail polish including gel and S&S, artificial/acrylic nails, or any other type of covering on natural nails including finger and toenails. If you have artificial nails, gel coating, etc. that needs to be removed by a nail salon please have this removed prior to surgery or surgery may need to be canceled/ delayed if the surgeon/ anesthesia feels like they are unable to be safely monitored.   Do not shave  48 hours prior to surgery.    Do not bring valuables to the hospital. [facility] IS NOT             RESPONSIBLE   FOR VALUABLES.   Contacts, glasses, or bridgework may not be worn into surgery.   Bring small overnight bag day of surgery.   DO NOT BRING YOUR HOME MEDICATIONS TO THE HOSPITAL. PHARMACY WILL DISPENSE MEDICATIONS LISTED ON YOUR MEDICATION LIST TO YOU DURING YOUR ADMISSION IN THE HOSPITAL!    Patients discharged on the day of surgery will not be allowed to drive home.  Someone NEEDS to stay with you for the first 24 hours after anesthesia.   Special Instructions: Bring a copy of your healthcare power of attorney and living will documents         the day of surgery if you haven't scanned them before.              Please read over the following fact sheets  you were given: IF YOU HAVE QUESTIONS ABOUT YOUR PRE-OP INSTRUCTIONS PLEASE CALL [phone]      Pre-operative 5 CHG Bath Instructions   You can play a key role in reducing the risk of infection after surgery. Your skin needs to be as free of germs as possible. You can reduce the number of germs on your skin by washing with CHG (chlorhexidine gluconate) soap before surgery. CHG is an antiseptic soap that kills germs and continues to kill germs even after washing.   DO NOT use if you have an allergy to chlorhexidine/CHG or antibacterial soaps. If your skin becomes reddened or irritated, stop using the CHG and notify one of our RNs at : [phone].   Please shower with the CHG soap starting 4 days before surgery using the following schedule:     Please keep in mind the following:  DO NOT shave, including legs and underarms, starting the day of your first shower.   You may shave your face at any point before/day of surgery.  Place clean sheets on your bed the day you start using CHG soap. Use a clean washcloth (not used since being washed) for each shower. DO NOT sleep with pets once you start using the CHG.   CHG Shower Instructions:  If you choose to wash your hair and private area, wash first with your normal shampoo/soap.  After you use shampoo/soap, rinse your hair and body thoroughly to remove shampoo/soap residue.  Turn the water OFF and apply about 3 tablespoons (45 ml) of CHG soap to a CLEAN washcloth.  Apply CHG soap ONLY FROM YOUR NECK DOWN TO YOUR TOES (washing for 3-5 minutes)  DO NOT use CHG soap on face, private areas, open wounds, or sores.  Pay special attention to the area where your surgery is being performed.  If you are having back surgery, having someone wash your back for you may be helpful. Wait 2 minutes after CHG soap is applied, then you may rinse off the CHG soap.  Pat dry with a clean towel  Put on clean clothes/pajamas   If you choose to wear lotion,  please use ONLY the CHG-compatible lotions on the back of this paper.     Additional instructions for the day of surgery: DO NOT APPLY any lotions, deodorants, cologne, or perfumes.   Put on clean/comfortable clothes.  Brush your teeth.  Ask your nurse before applying any prescription medications to the skin.   CHG Compatible Lotions   Aveeno Moisturizing lotion  Cetaphil Moisturizing Cream  Cetaphil Moisturizing Lotion  Clairol Herbal Essence Moisturizing Lotion, Dry Skin  Clairol Herbal Essence Moisturizing Lotion, Extra Dry Skin  Clairol Herbal Essence Moisturizing Lotion, Normal Skin  Curel Age Defying Therapeutic Moisturizing Lotion with Alpha Hydroxy  Curel Extreme Care Body Lotion  Curel Soothing Hands Moisturizing Hand Lotion  Curel Therapeutic Moisturizing Cream, Fragrance-Free  Curel Therapeutic Moisturizing Lotion, Fragrance-Free  Curel Therapeutic Moisturizing Lotion, Original Formula  Eucerin Daily Replenishing Lotion  Eucerin Dry Skin Therapy Plus Alpha Hydroxy Crme  Eucerin Dry Skin Therapy Plus Alpha Hydroxy Lotion  Eucerin Original Crme  Eucerin Original Lotion  Eucerin Plus Crme Eucerin Plus Lotion  Eucerin TriLipid Replenishing Lotion  Keri Anti-Bacterial Hand Lotion  Keri Deep Conditioning Original Lotion Dry Skin Formula Softly Scented  Keri Deep Conditioning Original Lotion, Fragrance Free Sensitive Skin Formula  Keri Lotion Fast Absorbing Fragrance Free Sensitive Skin Formula  Keri Lotion Fast Absorbing Softly Scented Dry Skin Formula  Keri Original Lotion  Keri Skin Renewal Lotion Keri Silky Smooth Lotion  Keri Silky Smooth Sensitive Skin Lotion  Nivea Body Creamy Conditioning Oil  Nivea Body Extra Enriched Lotion  Nivea Body Original Lotion  Nivea Body Sheer Moisturizing Lotion Nivea Crme  Nivea Skin Firming Lotion  NutraDerm 30 Skin Lotion  NutraDerm Skin Lotion  NutraDerm Therapeutic Skin Cream  NutraDerm Therapeutic Skin Lotion  ProShield  Protective Hand Cream  Provon moisturizing lotion   Incentive Spirometer  An incentive spirometer is a tool that can help keep your lungs clear and active. This tool measures how well you are filling your lungs with each breath. Taking long deep breaths may help reverse or decrease the chance of developing breathing (pulmonary) problems (especially infection) following: A long period of time when you are unable to move or be active. BEFORE THE PROCEDURE  If the spirometer includes an indicator to show your best effort, your nurse or respiratory therapist will set it to a desired goal. If possible, sit up straight or lean slightly forward. Try not to slouch. Hold the incentive spirometer in an upright position. INSTRUCTIONS FOR USE  Sit on the edge of your bed if possible, or sit up as far as you can in bed or on a chair. Hold the incentive spirometer in an upright position. Breathe out normally. Place the mouthpiece in your mouth and seal your lips tightly around it. Breathe in slowly and as deeply as possible, raising the piston or  the ball toward the top of the column. Hold your breath for 3-5 seconds or for as long as possible. Allow the piston or ball to fall to the bottom of the column. Remove the mouthpiece from your mouth and breathe out normally. Rest for a few seconds and repeat Steps 1 through 7 at least 10 times every 1-2 hours when you are awake. Take your time and take a few normal breaths between deep breaths. The spirometer may include an indicator to show your best effort. Use the indicator as a goal to work toward during each repetition. After each set of 10 deep breaths, practice coughing to be sure your lungs are clear. If you have an incision (the cut made at the time of surgery), support your incision when coughing by placing a pillow or rolled up towels firmly against it. Once you are able to get out of bed, walk around indoors and cough well. You may stop using the  incentive spirometer when instructed by your caregiver.  RISKS AND COMPLICATIONS Take your time so you do not get dizzy or light-headed. If you are in pain, you may need to take or ask for pain medication before doing incentive spirometry. It is harder to take a deep breath if you are having pain. AFTER USE Rest and breathe slowly and easily. It can be helpful to keep track of a log of your progress. Your caregiver can provide you with a simple table to help with this. If you are using the spirometer at home, follow these instructions: SEEK MEDICAL CARE IF:  You are having difficultly using the spirometer. You have trouble using the spirometer as often as instructed. Your pain medication is not giving enough relief while using the spirometer. You develop fever of 100.5 F (38.1 C) or higher. SEEK IMMEDIATE MEDICAL CARE IF:  You cough up bloody sputum that had not been present before. You develop fever of 102 F (38.9 C) or greater. You develop worsening pain at or near the incision site. MAKE SURE YOU:  Understand these instructions. Will watch your condition. Will get help right away if you are not doing well or get worse. Document Released: 04/18/2007 Document Revised: 02/28/2012 Document Reviewed: 06/19/2007 [facility] Patient Information 2014 [place], [place].   ________________________________________________________________________

## 2024-01-26 ENCOUNTER — Other Ambulatory Visit: Payer: Self-pay

## 2024-01-26 ENCOUNTER — Encounter (HOSPITAL_COMMUNITY): Payer: Self-pay

## 2024-01-26 ENCOUNTER — Encounter (HOSPITAL_COMMUNITY)
Admission: RE | Admit: 2024-01-26 | Discharge: 2024-01-26 | Disposition: A | Discharge status: Home / Self Care | Payer: Medicare Other | Source: Ambulatory Visit | Attending: Orthopedic Surgery | Admitting: Orthopedic Surgery

## 2024-01-26 VITALS — BP 115/82 | HR 79 | Temp 99.0°F | Ht 62.0 in | Wt 140.0 lb

## 2024-01-26 DIAGNOSIS — Z01818 Encounter for other preprocedural examination: Secondary | ICD-10-CM | POA: Diagnosis present

## 2024-01-26 HISTORY — DX: Pneumonia, unspecified organism: J18.9

## 2024-01-26 HISTORY — DX: Unspecified osteoarthritis, unspecified site: M19.90

## 2024-01-26 LAB — CBC
HCT: 44.7 % (ref 36.0–46.0)
Hemoglobin: 14.1 g/dL (ref 12.0–15.0)
MCH: 32.3 pg (ref 26.0–34.0)
MCHC: 31.5 g/dL (ref 30.0–36.0)
MCV: 102.3 fL — ABNORMAL HIGH (ref 80.0–100.0)
Platelets: 315 10*3/uL (ref 150–400)
RBC: 4.37 MIL/uL (ref 3.87–5.11)
RDW: 12.7 % (ref 11.5–15.5)
WBC: 6.4 10*3/uL (ref 4.0–10.5)
nRBC: 0 % (ref 0.0–0.2)

## 2024-01-26 LAB — SURGICAL PCR SCREEN
MRSA, PCR: NEGATIVE
Staphylococcus aureus: NEGATIVE

## 2024-01-26 NOTE — Progress Notes (Signed)
 For Anesthesia: PCP - Debrah Josette ORN., PA-C  Cardiologist - N/A  Bowel Prep reminder:  Chest x-ray -  EKG -  Stress Test -  ECHO -  Cardiac Cath -  Pacemaker/ICD device last checked: Pacemaker orders received: Device Rep notified:  Spinal Cord Stimulator:  Sleep Study - N/A CPAP -   Fasting Blood Sugar - N/A Checks Blood Sugar _____ times a day Date and result of last Hgb A1c-  Last dose of GLP1 agonist- N/A GLP1 instructions:   Last dose of SGLT-2 inhibitors- N/A SGLT-2 instructions:   Blood Thinner Instructions:N/A Aspirin  Instructions: Last Dose:  Activity level: Can go up a flight of stairs and activities of daily living without stopping and without chest pain and/or shortness of breath   Able to exercise without chest pain and/or shortness of breath  Anesthesia review:   Patient denies shortness of breath, fever, cough and chest pain at PAT appointment   Patient verbalized understanding of instructions that were given to them at the PAT appointment. Patient was also instructed that they will need to review over the PAT instructions again at home before surgery.

## 2024-02-05 NOTE — H&P (Signed)
TOTAL KNEE ADMISSION H&P  Patient is being admitted for right total knee arthroplasty.  Therapy Plans: outpatient therapy at [facility] Disposition: Home with husband Planned DVT Prophylaxis: aspirin 81mg  BID DME needed: walker PCP: Mady Gemma - TXA: IV Allergies: percocet - hallucinations Anesthesia Concerns: Knows her anesthesiologist through church BMI: 25.8 Last HgbA1c: Not diabetic   Other: - oxycodone, robaxin, tylenol, celebrex - No hx of VTE or cancer ** CORTISONE INJECTION LEFT KNEE **  Subjective:  Chief Complaint:right knee pain.  HPI: Meghan Harrison, 68 y.o. female, has a history of pain and functional disability in the right knee due to arthritis and has failed non-surgical conservative treatments for greater than 12 weeks to includeNSAID's and/or analgesics, corticosteriod injections, and activity modification.  Onset of symptoms was gradual, starting 2 years ago with gradually worsening course since that time. The patient noted no past surgery on the right knee(s).  Patient currently rates pain in the right knee(s) at 8 out of 10 with activity. Patient has worsening of pain with activity and weight bearing, pain that interferes with activities of daily living, and pain with passive range of motion.  Patient has evidence of joint space narrowing by imaging studies. There is no active infection.  Patient Active Problem List   Diagnosis Date Noted   Posterior vitreous detachment of left eye 04/22/2022   Epiretinal membrane, right eye 04/22/2022   Nuclear sclerotic cataract of both eyes 04/22/2022   Past Medical History:  Diagnosis Date   Arthritis    Pneumonia     Past Surgical History:  Procedure Laterality Date   ABDOMINAL HYSTERECTOMY     CESAREAN SECTION     x 3   CHOLECYSTECTOMY     COLONOSCOPY     ESOPHAGOGASTRODUODENOSCOPY ENDOSCOPY     HAND SURGERY Left    KNEE ARTHROSCOPY Left    REDUCTION MAMMAPLASTY Bilateral 09/2015   breast lift     REFRACTIVE SURGERY Bilateral    SLEEVE GASTROPLASTY     TONSILLECTOMY      No current facility-administered medications for this encounter.   Current Outpatient Medications  Medication Sig Dispense Refill Last Dose/Taking   diphenhydrAMINE (SOMINEX) 25 MG tablet Take 25 mg by mouth at bedtime as needed for sleep.   Taking As Needed   ferrous sulfate 325 (65 FE) MG EC tablet Take 325 mg by mouth in the morning.   Taking   Magnesium 250 MG TABS Take by mouth in the morning.   Taking   omeprazole (PRILOSEC) 20 MG capsule Take 20 mg by mouth daily before breakfast.   Taking   Potassium 99 MG TABS Take 99 mg by mouth in the morning.   Taking   rosuvastatin (CRESTOR) 10 MG tablet Take 10 mg by mouth at bedtime.   Taking   triamcinolone cream (KENALOG) 0.1 % Apply 1 Application topically 2 (two) times daily as needed (itchy/irritated skin (on back)).   Taking As Needed   WEGOVY 2.4 MG/0.75ML SOAJ Inject 2.4 mg into the skin every Tuesday.   Taking   Calcium Carbonate (CALCIUM 500 PO) Take 2 tablets by mouth in the morning. [employer] BoneUp      Cholecalciferol (VITAMIN D3) 1.25 MG (50000 UT) CAPS Take 50,000 Units by mouth every Thursday.      Cyanocobalamin (VITAMIN B-12) 5000 MCG TBDP Take 5,000 mcg by mouth in the morning.      Multiple Vitamin (MULTIVITAMIN WITH MINERALS) TABS tablet Take 1 tablet by mouth in the morning. Women's Multivitamin  Probiotic Product (PROBIOTIC PO) Take 1 capsule by mouth in the morning.      Allergies  Allergen Reactions   Oxycodone-Acetaminophen Itching and Other (See Comments)    hallucinations   Sulfa Antibiotics     abd distress   Amoxicillin-Pot Clavulanate Other (See Comments)    dizziness       Social History   Tobacco Use   Smoking status: Never   Smokeless tobacco: Never  Substance Use Topics   Alcohol use: Not on file    No family history on file.   Review of Systems  Constitutional:  Negative for chills and fever.   Respiratory:  Negative for cough and shortness of breath.   Cardiovascular:  Negative for chest pain.  Gastrointestinal:  Negative for nausea and vomiting.  Musculoskeletal:  Positive for arthralgias.     Objective:  Physical Exam Well nourished and well developed. General: Alert and oriented x3, cooperative and pleasant, no acute distress. Head: normocephalic, atraumatic, neck supple. Eyes: EOMI.  Musculoskeletal: Alert and oriented. No acute distress. Bilateral knees: No erythema, warmth, or effusions AROM 0-120 Mild tenderness to palpation about the medial joint lines Crepitus on ROM No ligamentous instability  Calves soft and nontender. Motor function intact in LE. Strength 5/5 LE bilaterally. Neuro: Distal pulses 2+. Sensation to light touch intact in LE.  Vital signs in last 24 hours:    Labs:   Estimated body mass index is 25.61 kg/m as calculated from the following:   Height as of 01/26/24: 5\' 2"  (1.575 m).   Weight as of 01/26/24: 63.5 kg.   Imaging Review Plain radiographs demonstrate severe degenerative joint disease of the right knee(s). The overall alignment isneutral. The bone quality appears to be adequate for age and reported activity level.      Assessment/Plan:  End stage arthritis, right knee   The patient history, physical examination, clinical judgment of the provider and imaging studies are consistent with end stage degenerative joint disease of the right knee(s) and total knee arthroplasty is deemed medically necessary. The treatment options including medical management, injection therapy arthroscopy and arthroplasty were discussed at length. The risks and benefits of total knee arthroplasty were presented and reviewed. The risks due to aseptic loosening, infection, stiffness, patella tracking problems, thromboembolic complications and other imponderables were discussed. The patient acknowledged the explanation, agreed to proceed with the plan  and consent was signed. Patient is being admitted for inpatient treatment for surgery, pain control, PT, OT, prophylactic antibiotics, VTE prophylaxis, progressive ambulation and ADL's and discharge planning. The patient is planning to be discharged  home.     Patient's anticipated LOS is less than 2 midnights, meeting these requirements: - Younger than 46 - Lives within 1 hour of care - Has a competent adult at home to recover with post-op recover - NO history of  - Chronic pain requiring opiods  - Diabetes  - Coronary Artery Disease  - Heart failure  - Heart attack  - Stroke  - DVT/VTE  - Cardiac arrhythmia  - Respiratory Failure/COPD  - Renal failure  - Anemia  - Advanced Liver disease  Rosalene Billings, PA-C Orthopedic Surgery [facility] [place] [phone]

## 2024-02-07 ENCOUNTER — Observation Stay (HOSPITAL_COMMUNITY)
Admission: RE | Admit: 2024-02-07 | Discharge: 2024-02-08 | Disposition: A | Discharge status: Home / Self Care | Payer: Medicare Other | Source: Ambulatory Visit | Attending: Orthopedic Surgery | Admitting: Orthopedic Surgery

## 2024-02-07 ENCOUNTER — Ambulatory Visit (HOSPITAL_COMMUNITY): Payer: Self-pay | Admitting: Certified Registered Nurse Anesthetist

## 2024-02-07 ENCOUNTER — Other Ambulatory Visit: Payer: Self-pay

## 2024-02-07 ENCOUNTER — Encounter (HOSPITAL_COMMUNITY): Payer: Self-pay | Admitting: Orthopedic Surgery

## 2024-02-07 ENCOUNTER — Encounter (HOSPITAL_COMMUNITY)
Admission: RE | Disposition: A | Discharge status: Home / Self Care | Payer: Self-pay | Source: Ambulatory Visit | Attending: Orthopedic Surgery

## 2024-02-07 DIAGNOSIS — M1711 Unilateral primary osteoarthritis, right knee: Secondary | ICD-10-CM

## 2024-02-07 DIAGNOSIS — Z96651 Presence of right artificial knee joint: Principal | ICD-10-CM

## 2024-02-07 DIAGNOSIS — Z79899 Other long term (current) drug therapy: Secondary | ICD-10-CM | POA: Insufficient documentation

## 2024-02-07 HISTORY — PX: TOTAL KNEE ARTHROPLASTY: SHX125

## 2024-02-07 SURGERY — ARTHROPLASTY, KNEE, TOTAL
Anesthesia: Spinal | Site: Knee | Laterality: Right

## 2024-02-07 MED ORDER — PROPOFOL 500 MG/50ML IV EMUL
INTRAVENOUS | Status: DC | PRN
  Administered 2024-02-07: 70 ug/kg/min via INTRAVENOUS

## 2024-02-07 MED ORDER — TRANEXAMIC ACID-NACL 1000-0.7 MG/100ML-% IV SOLN
1000.0000 mg | INTRAVENOUS | Status: AC
  Administered 2024-02-07: 1000 mg via INTRAVENOUS
  Filled 2024-02-07: qty 100

## 2024-02-07 MED ORDER — DEXAMETHASONE SODIUM PHOSPHATE 10 MG/ML IJ SOLN
10.0000 mg | Freq: Once | INTRAMUSCULAR | Status: AC
  Administered 2024-02-08: 10 mg via INTRAVENOUS
  Filled 2024-02-07: qty 1

## 2024-02-07 MED ORDER — POLYETHYLENE GLYCOL 3350 17 G PO PACK
17.0000 g | PACK | Freq: Two times a day (BID) | ORAL | Status: DC
Start: 2024-02-07 — End: 2024-02-08
  Administered 2024-02-07 – 2024-02-08 (×2): 17 g via ORAL
  Filled 2024-02-07 (×2): qty 1

## 2024-02-07 MED ORDER — PANTOPRAZOLE SODIUM 40 MG PO TBEC
40.0000 mg | DELAYED_RELEASE_TABLET | Freq: Every day | ORAL | Status: DC
  Administered 2024-02-08: 40 mg via ORAL
  Filled 2024-02-07: qty 1

## 2024-02-07 MED ORDER — SODIUM CHLORIDE (PF) 0.9 % IJ SOLN
INTRAMUSCULAR | Status: DC | PRN
Start: 2024-02-07 — End: 2024-02-07
  Administered 2024-02-07: 30 mL

## 2024-02-07 MED ORDER — METOCLOPRAMIDE HCL 5 MG PO TABS: 5.0000 mg | ORAL_TABLET | Freq: Three times a day (TID) | ORAL | Status: DC | PRN

## 2024-02-07 MED ORDER — ONDANSETRON HCL 4 MG/2ML IJ SOLN
INTRAMUSCULAR | Status: AC
  Filled 2024-02-07: qty 2

## 2024-02-07 MED ORDER — PHENOL 1.4 % MT LIQD: 1.0000 | OROMUCOSAL | Status: DC | PRN

## 2024-02-07 MED ORDER — SODIUM CHLORIDE 0.9% FLUSH: 3.0000 mL | INTRAVENOUS | Status: DC | PRN

## 2024-02-07 MED ORDER — FENTANYL CITRATE PF 50 MCG/ML IJ SOSY
100.0000 ug | PREFILLED_SYRINGE | Freq: Once | INTRAMUSCULAR | Status: AC
  Administered 2024-02-07: 50 ug via INTRAVENOUS
  Filled 2024-02-07: qty 2

## 2024-02-07 MED ORDER — PROPOFOL 10 MG/ML IV BOLUS
INTRAVENOUS | Status: DC | PRN
  Administered 2024-02-07 (×3): 20 mg via INTRAVENOUS
  Administered 2024-02-07: 30 mg via INTRAVENOUS
  Administered 2024-02-07: 20 mg via INTRAVENOUS
  Administered 2024-02-07: 30 mg via INTRAVENOUS

## 2024-02-07 MED ORDER — AMISULPRIDE (ANTIEMETIC) 5 MG/2ML IV SOLN: 10.0000 mg | Freq: Once | INTRAVENOUS | Status: DC | PRN

## 2024-02-07 MED ORDER — CEFAZOLIN SODIUM-DEXTROSE 2-4 GM/100ML-% IV SOLN
2.0000 g | INTRAVENOUS | Status: AC
  Administered 2024-02-07: 2 g via INTRAVENOUS
  Filled 2024-02-07: qty 100

## 2024-02-07 MED ORDER — PROPOFOL 1000 MG/100ML IV EMUL
INTRAVENOUS | Status: AC
  Filled 2024-02-07: qty 100

## 2024-02-07 MED ORDER — 0.9 % SODIUM CHLORIDE (POUR BTL) OPTIME
TOPICAL | Status: DC | PRN
  Administered 2024-02-07: 1000 mL

## 2024-02-07 MED ORDER — SODIUM CHLORIDE 0.9 % IV BOLUS
500.0000 mL | Freq: Once | INTRAVENOUS | Status: AC
  Administered 2024-02-07: 500 mL via INTRAVENOUS

## 2024-02-07 MED ORDER — OXYCODONE HCL 5 MG PO TABS
5.0000 mg | ORAL_TABLET | ORAL | Status: DC | PRN
  Administered 2024-02-07 – 2024-02-08 (×3): 5 mg via ORAL
  Filled 2024-02-07: qty 1
  Filled 2024-02-07: qty 2
  Filled 2024-02-07: qty 1

## 2024-02-07 MED ORDER — METHOCARBAMOL 1000 MG/10ML IJ SOLN: 500.0000 mg | Freq: Four times a day (QID) | INTRAMUSCULAR | Status: DC | PRN

## 2024-02-07 MED ORDER — BUPIVACAINE-EPINEPHRINE (PF) 0.25% -1:200000 IJ SOLN
INTRAMUSCULAR | Status: DC | PRN
  Administered 2024-02-07: 30 mL via PERINEURAL

## 2024-02-07 MED ORDER — STERILE WATER FOR IRRIGATION IR SOLN
Status: DC | PRN
  Administered 2024-02-07: 1000 mL

## 2024-02-07 MED ORDER — ONDANSETRON HCL 4 MG/2ML IJ SOLN
INTRAMUSCULAR | Status: DC | PRN
Start: 2024-02-07 — End: 2024-02-07
  Administered 2024-02-07: 4 mg via INTRAVENOUS

## 2024-02-07 MED ORDER — ORAL CARE MOUTH RINSE: 15.0000 mL | Freq: Once | OROMUCOSAL | Status: AC

## 2024-02-07 MED ORDER — DEXAMETHASONE SODIUM PHOSPHATE 10 MG/ML IJ SOLN
8.0000 mg | Freq: Once | INTRAMUSCULAR | Status: AC
  Administered 2024-02-07: 8 mg via INTRAVENOUS

## 2024-02-07 MED ORDER — POVIDONE-IODINE 10 % EX SWAB: 2.0000 | Freq: Once | CUTANEOUS | Status: DC

## 2024-02-07 MED ORDER — BUPIVACAINE-EPINEPHRINE 0.25% -1:200000 IJ SOLN
INTRAMUSCULAR | Status: AC
  Filled 2024-02-07: qty 1

## 2024-02-07 MED ORDER — DIPHENHYDRAMINE HCL 12.5 MG/5ML PO ELIX: 12.5000 mg | ORAL_SOLUTION | ORAL | Status: DC | PRN

## 2024-02-07 MED ORDER — MENTHOL 3 MG MT LOZG: 1.0000 | LOZENGE | OROMUCOSAL | Status: DC | PRN

## 2024-02-07 MED ORDER — METHYLPREDNISOLONE ACETATE 40 MG/ML IJ SUSP
INTRAMUSCULAR | Status: AC
  Filled 2024-02-07: qty 1

## 2024-02-07 MED ORDER — ROSUVASTATIN CALCIUM 10 MG PO TABS
10.0000 mg | ORAL_TABLET | Freq: Every day | ORAL | Status: DC
  Administered 2024-02-07: 10 mg via ORAL
  Filled 2024-02-07: qty 1

## 2024-02-07 MED ORDER — CELECOXIB 200 MG PO CAPS
200.0000 mg | ORAL_CAPSULE | Freq: Two times a day (BID) | ORAL | Status: DC
  Administered 2024-02-07 – 2024-02-08 (×2): 200 mg via ORAL
  Filled 2024-02-07 (×2): qty 1

## 2024-02-07 MED ORDER — BUPIVACAINE HCL 0.25 % IJ SOLN
INTRAMUSCULAR | Status: AC
  Filled 2024-02-07: qty 1

## 2024-02-07 MED ORDER — ONDANSETRON HCL 4 MG/2ML IJ SOLN: 4.0000 mg | Freq: Four times a day (QID) | INTRAMUSCULAR | Status: DC | PRN

## 2024-02-07 MED ORDER — CEFAZOLIN SODIUM-DEXTROSE 2-4 GM/100ML-% IV SOLN
2.0000 g | Freq: Four times a day (QID) | INTRAVENOUS | Status: AC
  Administered 2024-02-07 – 2024-02-08 (×2): 2 g via INTRAVENOUS
  Filled 2024-02-07 (×2): qty 100

## 2024-02-07 MED ORDER — METOCLOPRAMIDE HCL 5 MG/ML IJ SOLN: 5.0000 mg | Freq: Three times a day (TID) | INTRAMUSCULAR | Status: DC | PRN

## 2024-02-07 MED ORDER — LACTATED RINGERS IV SOLN: INTRAVENOUS | Status: DC

## 2024-02-07 MED ORDER — ALUM & MAG HYDROXIDE-SIMETH 200-200-20 MG/5ML PO SUSP: 30.0000 mL | ORAL | Status: DC | PRN

## 2024-02-07 MED ORDER — KETOROLAC TROMETHAMINE 30 MG/ML IJ SOLN
INTRAMUSCULAR | Status: AC
  Filled 2024-02-07: qty 1

## 2024-02-07 MED ORDER — PHENYLEPHRINE HCL-NACL 20-0.9 MG/250ML-% IV SOLN
INTRAVENOUS | Status: DC | PRN
  Administered 2024-02-07: 30 ug/min via INTRAVENOUS

## 2024-02-07 MED ORDER — ONDANSETRON HCL 4 MG PO TABS: 4.0000 mg | ORAL_TABLET | Freq: Four times a day (QID) | ORAL | Status: DC | PRN

## 2024-02-07 MED ORDER — MIDAZOLAM HCL 2 MG/2ML IJ SOLN
2.0000 mg | Freq: Once | INTRAMUSCULAR | Status: DC
  Filled 2024-02-07: qty 2

## 2024-02-07 MED ORDER — SENNA 8.6 MG PO TABS
2.0000 | ORAL_TABLET | Freq: Every day | ORAL | Status: DC
  Administered 2024-02-07: 17.2 mg via ORAL
  Filled 2024-02-07: qty 2

## 2024-02-07 MED ORDER — METHOCARBAMOL 500 MG PO TABS: 500.0000 mg | ORAL_TABLET | Freq: Four times a day (QID) | ORAL | Status: DC | PRN

## 2024-02-07 MED ORDER — SODIUM CHLORIDE 0.9% FLUSH
3.0000 mL | Freq: Two times a day (BID) | INTRAVENOUS | Status: DC
Start: 2024-02-07 — End: 2024-02-08
  Administered 2024-02-08: 10 mL via INTRAVENOUS

## 2024-02-07 MED ORDER — KETOROLAC TROMETHAMINE 30 MG/ML IJ SOLN
INTRAMUSCULAR | Status: DC | PRN
Start: 2024-02-07 — End: 2024-02-07
  Administered 2024-02-07: 30 mg

## 2024-02-07 MED ORDER — FENTANYL CITRATE PF 50 MCG/ML IJ SOSY: 25.0000 ug | PREFILLED_SYRINGE | INTRAMUSCULAR | Status: DC | PRN

## 2024-02-07 MED ORDER — SODIUM CHLORIDE (PF) 0.9 % IJ SOLN
INTRAMUSCULAR | Status: AC
  Filled 2024-02-07: qty 30

## 2024-02-07 MED ORDER — DEXMEDETOMIDINE HCL IN NACL 80 MCG/20ML IV SOLN
INTRAVENOUS | Status: DC | PRN
  Administered 2024-02-07: 8 ug via INTRAVENOUS

## 2024-02-07 MED ORDER — ASPIRIN 81 MG PO CHEW
81.0000 mg | CHEWABLE_TABLET | Freq: Two times a day (BID) | ORAL | Status: DC
  Administered 2024-02-07 – 2024-02-08 (×2): 81 mg via ORAL
  Filled 2024-02-07 (×2): qty 1

## 2024-02-07 MED ORDER — ACETAMINOPHEN 500 MG PO TABS
1000.0000 mg | ORAL_TABLET | Freq: Four times a day (QID) | ORAL | Status: DC
  Administered 2024-02-07 – 2024-02-08 (×3): 1000 mg via ORAL
  Filled 2024-02-07 (×3): qty 2

## 2024-02-07 MED ORDER — OXYCODONE HCL 5 MG PO TABS: 10.0000 mg | ORAL_TABLET | ORAL | Status: DC | PRN

## 2024-02-07 MED ORDER — HYDROMORPHONE HCL 1 MG/ML IJ SOLN: 0.5000 mg | INTRAMUSCULAR | Status: DC | PRN

## 2024-02-07 MED ORDER — CHLORHEXIDINE GLUCONATE 0.12 % MT SOLN
15.0000 mL | Freq: Once | OROMUCOSAL | Status: AC
  Administered 2024-02-07: 15 mL via OROMUCOSAL

## 2024-02-07 MED ORDER — PHENYLEPHRINE HCL-NACL 20-0.9 MG/250ML-% IV SOLN
INTRAVENOUS | Status: AC
  Filled 2024-02-07: qty 250

## 2024-02-07 MED ORDER — ACETAMINOPHEN 500 MG PO TABS
1000.0000 mg | ORAL_TABLET | Freq: Once | ORAL | Status: AC
  Administered 2024-02-07: 1000 mg via ORAL
  Filled 2024-02-07: qty 2

## 2024-02-07 MED ORDER — SODIUM CHLORIDE 0.9% FLUSH: 3.0000 mL | Freq: Two times a day (BID) | INTRAVENOUS | Status: DC

## 2024-02-07 MED ORDER — BISACODYL 10 MG RE SUPP: 10.0000 mg | Freq: Every day | RECTAL | Status: DC | PRN

## 2024-02-07 MED ORDER — SODIUM CHLORIDE 0.9 % IR SOLN
Status: DC | PRN
  Administered 2024-02-07: 1000 mL

## 2024-02-07 MED ORDER — DEXAMETHASONE SODIUM PHOSPHATE 10 MG/ML IJ SOLN
INTRAMUSCULAR | Status: AC
Start: 2024-02-07 — End: ?
  Filled 2024-02-07: qty 1

## 2024-02-07 MED ORDER — ONDANSETRON HCL 4 MG/2ML IJ SOLN: 4.0000 mg | Freq: Once | INTRAMUSCULAR | Status: DC | PRN

## 2024-02-07 MED ORDER — TRANEXAMIC ACID-NACL 1000-0.7 MG/100ML-% IV SOLN
1000.0000 mg | Freq: Once | INTRAVENOUS | Status: AC
  Administered 2024-02-07: 1000 mg via INTRAVENOUS
  Filled 2024-02-07: qty 100

## 2024-02-07 MED ORDER — BUPIVACAINE IN DEXTROSE 0.75-8.25 % IT SOLN
INTRATHECAL | Status: DC | PRN
  Administered 2024-02-07: 1.6 mL via INTRATHECAL

## 2024-02-07 SURGICAL SUPPLY — 46 items
ATTUNE MED ANAT PAT 32 KNEE (Knees) IMPLANT
BAG COUNTER SPONGE SURGICOUNT (BAG) IMPLANT
BAG ZIPLOCK 12X15 (MISCELLANEOUS) IMPLANT
BASEPLATE TIB CMT FB PCKT SZ4 (Stem) IMPLANT
BLADE SAW SGTL 13.0X1.19X90.0M (BLADE) ×1 IMPLANT
BNDG ELASTIC 6INX 5YD STR LF (GAUZE/BANDAGES/DRESSINGS) ×1 IMPLANT
BOWL SMART MIX CTS (DISPOSABLE) ×1 IMPLANT
CEMENT HV SMART SET (Cement) ×2 IMPLANT
COMP FEM CMT ATTUNE KNEE 4 RT (Joint) ×1 IMPLANT
COMPONENT FEM CMT ATTN KN 4 RT (Joint) IMPLANT
COVER SURGICAL LIGHT HANDLE (MISCELLANEOUS) ×1 IMPLANT
CUFF TRNQT CYL 34X4.125X (TOURNIQUET CUFF) ×1 IMPLANT
DERMABOND ADVANCED .7 DNX12 (GAUZE/BANDAGES/DRESSINGS) ×1 IMPLANT
DRAPE U-SHAPE 47X51 STRL (DRAPES) ×1 IMPLANT
DRESSING AQUACEL AG SP 3.5X10 (GAUZE/BANDAGES/DRESSINGS) ×1 IMPLANT
DRSG AQUACEL AG ADV 3.5X10 (GAUZE/BANDAGES/DRESSINGS) IMPLANT
DRSG AQUACEL AG SP 3.5X10 (GAUZE/BANDAGES/DRESSINGS) ×1 IMPLANT
DURAPREP 26ML APPLICATOR (WOUND CARE) ×2 IMPLANT
ELECT REM PT RETURN 15FT ADLT (MISCELLANEOUS) ×1 IMPLANT
GLOVE BIO SURGEON STRL SZ 6 (GLOVE) ×1 IMPLANT
GLOVE BIOGEL PI IND STRL 6.5 (GLOVE) ×1 IMPLANT
GLOVE BIOGEL PI IND STRL 7.5 (GLOVE) ×1 IMPLANT
GLOVE ORTHO TXT STRL SZ7.5 (GLOVE) ×2 IMPLANT
GOWN STRL REUS W/ TWL LRG LVL3 (GOWN DISPOSABLE) ×2 IMPLANT
HOLDER FOLEY CATH W/STRAP (MISCELLANEOUS) IMPLANT
INSERT TIB ATTUNE KNEE 4 12 RM (Insert) IMPLANT
KIT TURNOVER KIT A (KITS) IMPLANT
MANIFOLD NEPTUNE II (INSTRUMENTS) ×1 IMPLANT
NDL SAFETY ECLIPSE 18X1.5 (NEEDLE) IMPLANT
NS IRRIG 1000ML POUR BTL (IV SOLUTION) ×1 IMPLANT
PACK TOTAL KNEE CUSTOM (KITS) ×1 IMPLANT
PIN FIX SIGMA LCS THRD HI (PIN) IMPLANT
PROTECTOR NERVE ULNAR (MISCELLANEOUS) ×1 IMPLANT
SET HNDPC FAN SPRY TIP SCT (DISPOSABLE) ×1 IMPLANT
SET PAD KNEE POSITIONER (MISCELLANEOUS) ×1 IMPLANT
SPIKE FLUID TRANSFER (MISCELLANEOUS) ×2 IMPLANT
SUT MNCRL AB 4-0 PS2 18 (SUTURE) ×1 IMPLANT
SUT STRATAFIX PDS+ 0 24IN (SUTURE) ×1 IMPLANT
SUT VIC AB 1 CT1 36 (SUTURE) ×1 IMPLANT
SUT VIC AB 2-0 CT1 TAPERPNT 27 (SUTURE) ×2 IMPLANT
SYR 3ML LL SCALE MARK (SYRINGE) ×1 IMPLANT
TOWEL GREEN STERILE FF (TOWEL DISPOSABLE) ×1 IMPLANT
TRAY FOLEY MTR SLVR 16FR STAT (SET/KITS/TRAYS/PACK) ×1 IMPLANT
TUBE SUCTION HIGH CAP CLEAR NV (SUCTIONS) ×1 IMPLANT
WATER STERILE IRR 1000ML POUR (IV SOLUTION) ×2 IMPLANT
WRAP KNEE MAXI GEL POST OP (GAUZE/BANDAGES/DRESSINGS) ×1 IMPLANT

## 2024-02-07 NOTE — Discharge Instructions (Signed)

## 2024-02-07 NOTE — Interval H&P Note (Signed)
History and Physical Interval Note:  02/07/2024 1:31 PM  Meghan Harrison  has presented today for surgery, with the diagnosis of Right knee osteoarthritis.  The various methods of treatment have been discussed with the patient and family. After consideration of risks, benefits and other options for treatment, the patient has consented to  Procedure(s): TOTAL KNEE ARTHROPLASTY (Right) as a surgical intervention.  The patient's history has been reviewed, patient examined, no change in status, stable for surgery.  I have reviewed the patient's chart and labs.  Questions were answered to the patient's satisfaction.     [name]

## 2024-02-07 NOTE — Anesthesia Preprocedure Evaluation (Addendum)
Anesthesia Evaluation  Patient identified by MRN, date of birth, ID band Patient awake    Reviewed: Allergy & Precautions, NPO status , Patient's Chart, lab work & pertinent test results  Airway Mallampati: II  TM Distance: >3 FB Neck ROM: Full    Dental  (+) Teeth Intact, Dental Advisory Given   Pulmonary neg pulmonary ROS   Pulmonary exam normal breath sounds clear to auscultation       Cardiovascular Exercise Tolerance: Good negative cardio ROS Normal cardiovascular exam Rhythm:Regular Rate:Normal     Neuro/Psych negative neurological ROS  negative psych ROS   GI/Hepatic Neg liver ROS,GERD  Medicated and Controlled,,SLEEVE GASTROPLASTY   Endo/Other  negative endocrine ROS    Renal/GU negative Renal ROS     Musculoskeletal  (+) Arthritis ,  Right knee osteoarthritis   Abdominal   Peds  Hematology negative hematology ROS (+) Plt 315k   Anesthesia Other Findings Day of surgery medications reviewed with the patient.  Reproductive/Obstetrics                             Anesthesia Physical Anesthesia Plan  ASA: 2  Anesthesia Plan: Spinal   Post-op Pain Management: Regional block* and Tylenol PO (pre-op)*   Induction: Intravenous  PONV Risk Score and Plan: 2 and Midazolam, TIVA, Dexamethasone and Ondansetron  Airway Management Planned: Natural Airway and Simple Face Mask  Additional Equipment:   Intra-op Plan:   Post-operative Plan:   Informed Consent: I have reviewed the patients History and Physical, chart, labs and discussed the procedure including the risks, benefits and alternatives for the proposed anesthesia with the patient or authorized representative who has indicated his/her understanding and acceptance.     Dental advisory given  Plan Discussed with: CRNA  Anesthesia Plan Comments:        Anesthesia Quick Evaluation

## 2024-02-07 NOTE — Transfer of Care (Signed)
Immediate Anesthesia Transfer of Care Note  Patient: Meghan Harrison  Procedure(s) Performed: TOTAL KNEE ARTHROPLASTY (Right: Knee)  Patient Location: PACU  Anesthesia Type:Regional and MAC combined with regional for post-op pain  Level of Consciousness: awake, alert , oriented, and patient cooperative  Airway & Oxygen Therapy: Patient Spontanous Breathing and Patient connected to face mask oxygen  Post-op Assessment: Report given to RN and Post -op Vital signs reviewed and stable  Post vital signs: Reviewed and stable  Last Vitals:  Vitals Value Taken Time  BP 94/61 02/07/24 1623  Temp    Pulse 85 02/07/24 1625  Resp 9 02/07/24 1625  SpO2 100 % 02/07/24 1625  Vitals shown include unfiled device data.  Last Pain:  Vitals:   02/07/24 1330  TempSrc: Oral  PainSc:          Complications: No notable events documented.

## 2024-02-07 NOTE — Op Note (Signed)
NAME:  Meghan Harrison                      MEDICAL RECORD NO.:  [id number]                             FACILITY:  [facility]      PHYSICIAN:  Madlyn Frankel. Charlann Boxer, M.D.  DATE OF BIRTH:  1956/07/10      DATE OF PROCEDURE:  02/07/2024                                     OPERATIVE REPORT         PREOPERATIVE DIAGNOSIS:  Right knee osteoarthritis.      POSTOPERATIVE DIAGNOSIS:  Right knee osteoarthritis.      FINDINGS:  The patient was noted to have complete loss of cartilage and   bone-on-bone arthritis with associated osteophytes in the medial and patellofemoral compartments of   the knee with a significant synovitis and associated effusion.  The patient had failed months of conservative treatment including medications, injection therapy, activity modification.     PROCEDURE:  Right total knee replacement.      COMPONENTS USED:  DePuy Attune FB CR MS knee   system, a size 4N femur, 4 tibia, size 12 mm CR MS AOX insert, and 32 anatomic patellar   button.      SURGEON:  Madlyn Frankel. Charlann Boxer, M.D.      ASSISTANT:  Rosalene Billings, PA-C.      ANESTHESIA:  Regional and Spinal.      SPECIMENS:  None.      COMPLICATION:  None.      DRAINS:  None.  EBL: <200 cc      TOURNIQUET TIME:  25 min at 225 mmHg     The patient was stable to the recovery room.      INDICATION FOR PROCEDURE:  Meghan Harrison is a 68 y.o. female patient of   mine.  The patient had been seen, evaluated, and treated for months conservatively in the   office with medication, activity modification, and injections.  The patient had   radiographic changes of bone-on-bone arthritis with endplate sclerosis and osteophytes noted.  Based on the radiographic changes and failed conservative measures, the patient   decided to proceed with definitive treatment, total knee replacement.  Risks of infection, DVT, component failure, need for revision surgery, neurovascular injury were reviewed in the office setting.  The postop course was  reviewed stressing the efforts to maximize post-operative satisfaction and function.  Consent was obtained for benefit of pain   relief.      PROCEDURE IN DETAIL:  The patient was brought to the operative theater.   Once adequate anesthesia, preoperative antibiotics, 2 gm of Ancef,1 gm of Tranexamic Acid, and 10 mg of Decadron administered, the patient was positioned supine with a right thigh tourniquet placed.  The  right lower extremity was prepped and draped in sterile fashion.  A time-   out was performed identifying the patient, planned procedure, and the appropriate extremity.      The right lower extremity was placed in the [facility] leg holder.  The leg was   exsanguinated, tourniquet elevated to 225 mmHg.  A midline incision was   made followed by median parapatellar arthrotomy.  Following initial   exposure, attention was first directed  to the patella.  Precut   measurement was noted to be 21-22 mm.  I resected down to 13 mm and used a   32 anatomic patellar button to restore patellar height as well as cover the cut surface.      The lug holes were drilled and a metal shim was placed to protect the   patella from retractors and saw blade during the procedure.      At this point, attention was now directed to the femur.  The femoral   canal was opened with a drill, irrigated to try to prevent fat emboli.  An   intramedullary rod was passed at 3 degrees valgus, 9 mm of bone was   resected off the distal femur.  Following this resection, the tibia was   subluxated anteriorly.  Using the extramedullary guide, 2 mm of bone was resected off   the proximal medial tibia.  We confirmed the gap would be   stable medially and laterally with a size 6 spacer block as well as confirmed that the tibial cut was perpendicular in the coronal plane, checking with an alignment rod.      Once this was done, I sized the femur to be a size 4 in the anterior-   posterior dimension, chose a narrow component  based on medial and   lateral dimension.  The size 4 rotation block was then pinned in   position anterior referenced using the C-clamp to set rotation.  The   anterior, posterior, and  chamfer cuts were made without difficulty nor   notching making certain that I was along the anterior cortex to help   with flexion gap stability.      The final box cut was made off the lateral aspect of distal femur.      At this point, the tibia was sized to be a size 4.  The size 4 tray was   then pinned in position through the medial third of the tubercle,   drilled, and keel punched.  Trial reduction was now carried with a 4 femur,  4 tibia, a size 12 mm CR MS insert, and the 32 anatomic patella botton.  The knee was brought to full extension with good flexion stability with the patella   tracking through the trochlea without application of pressure.  Given   all these findings the trial components removed.  Final components were   opened and cement was mixed.  The knee was irrigated with normal saline solution and pulse lavage.  The synovial lining was   then injected with 30 cc of 0.25% Marcaine with epinephrine, 1 cc of Toradol and 30 cc of NS for a total of 61 cc.     Final implants were then cemented onto cleaned and dried cut surfaces of bone with the knee brought to extension with a size 12 mm CR MS trial insert.      Once the cement had fully cured, excess cement was removed   throughout the knee.  I confirmed that I was satisfied with the range of   motion and stability, and the final size 12 mm CR MS AOX insert was chosen.  It was   placed into the knee.      The tourniquet had been let down at 25 minutes.  No significant   hemostasis was required.  The extensor mechanism was then reapproximated using #1 Vicryl and #1 Stratafix sutures with the knee   in flexion.  The  remaining wound was closed with 2-0 Vicryl and running 4-0 Monocryl.   The knee was cleaned, dried, dressed sterilely  using Dermabond and   Aquacel dressing.  The patient was then   brought to recovery room in stable condition, tolerating the procedure   well.   Please note that Physician Assistant, Rosalene Billings, PA-C was present for the entirety of the case, and was utilized for pre-operative positioning, peri-operative retractor management, general facilitation of the procedure and for primary wound closure at the end of the case.              Madlyn Frankel Charlann Boxer, M.D.    02/07/2024 1:31 PM

## 2024-02-08 DIAGNOSIS — M1711 Unilateral primary osteoarthritis, right knee: Secondary | ICD-10-CM | POA: Diagnosis not present

## 2024-02-08 LAB — CBC
HCT: 36.5 % (ref 36.0–46.0)
Hemoglobin: 11.8 g/dL — ABNORMAL LOW (ref 12.0–15.0)
MCH: 32.5 pg (ref 26.0–34.0)
MCHC: 32.3 g/dL (ref 30.0–36.0)
MCV: 100.6 fL — ABNORMAL HIGH (ref 80.0–100.0)
Platelets: 283 10*3/uL (ref 150–400)
RBC: 3.63 MIL/uL — ABNORMAL LOW (ref 3.87–5.11)
RDW: 12.4 % (ref 11.5–15.5)
WBC: 10.4 10*3/uL (ref 4.0–10.5)
nRBC: 0 % (ref 0.0–0.2)

## 2024-02-08 LAB — BASIC METABOLIC PANEL
Anion gap: 5 (ref 5–15)
BUN: 15 mg/dL (ref 8–23)
CO2: 25 mmol/L (ref 22–32)
Calcium: 8.5 mg/dL — ABNORMAL LOW (ref 8.9–10.3)
Chloride: 106 mmol/L (ref 98–111)
Creatinine, Ser: 0.67 mg/dL (ref 0.44–1.00)
GFR, Estimated: >60 mL/min (ref >60)
Glucose, Bld: 154 mg/dL — ABNORMAL HIGH (ref 70–99)
Potassium: 4.3 mmol/L (ref 3.5–5.1)
Sodium: 136 mmol/L (ref 135–145)

## 2024-02-08 MED ORDER — OXYCODONE HCL 5 MG PO TABS: 5.0000 mg | ORAL_TABLET | ORAL | 0 refills | Status: DC | PRN

## 2024-02-08 MED ORDER — SODIUM CHLORIDE 0.9 % IV BOLUS: 250.0000 mL | Freq: Once | INTRAVENOUS | Status: AC

## 2024-02-08 MED ORDER — METHOCARBAMOL 500 MG PO TABS: 500.0000 mg | ORAL_TABLET | Freq: Four times a day (QID) | ORAL | 2 refills | Status: DC | PRN

## 2024-02-08 MED ORDER — SENNA 8.6 MG PO TABS: 2.0000 | ORAL_TABLET | Freq: Every day | ORAL | 0 refills | Status: AC

## 2024-02-08 MED ORDER — CELECOXIB 200 MG PO CAPS: 200.0000 mg | ORAL_CAPSULE | Freq: Two times a day (BID) | ORAL | 0 refills | Status: DC

## 2024-02-08 MED ORDER — ASPIRIN 81 MG PO CHEW: 81.0000 mg | CHEWABLE_TABLET | Freq: Two times a day (BID) | ORAL | 0 refills | Status: AC

## 2024-02-08 MED ORDER — POLYETHYLENE GLYCOL 3350 17 G PO PACK: 17.0000 g | PACK | Freq: Two times a day (BID) | ORAL | 0 refills | Status: DC

## 2024-02-08 NOTE — Evaluation (Signed)
Physical Therapy Evaluation Patient Details Name: Meghan Harrison MRN: [id number] DOB: 02-20-1956 Today's Date: 02/08/2024  History of Present Illness  68 yo female s/p R TKA 02/07/24  Clinical Impression  On eval, pt was CGA for mobility. She walked ~100 feet with a RW. Pain controlled per pt report. Will plan to have a 2nd session prior to potential d/c home if pt meets her PT goals.         If plan is discharge home, recommend the following: A little help with walking and/or transfers;A little help with bathing/dressing/bathroom;Assistance with cooking/housework;Assist for transportation;Help with stairs or ramp for entrance   Can travel by private vehicle        Equipment Recommendations Rolling walker (2 wheels);None recommended by PT  Recommendations for Other Services       Functional Status Assessment Patient has had a recent decline in their functional status and demonstrates the ability to make significant improvements in function in a reasonable and predictable amount of time.     Precautions / Restrictions Precautions Precautions: Fall;Knee Restrictions Weight Bearing Restrictions Per Provider Order: No Other Position/Activity Restrictions: WBAT      Mobility  Bed Mobility Overal bed mobility: Needs Assistance Bed Mobility: Supine to Sit     Supine to sit: Supervision, HOB elevated     General bed mobility comments: Supv for safety.    Transfers Overall transfer level: Needs assistance Equipment used: Rolling walker (2 wheels) Transfers: Sit to/from Stand Sit to Stand: Contact guard assist           General transfer comment: Cues for safety, technique, hand/LE placement.    Ambulation/Gait Ambulation/Gait assistance: Contact guard assist Gait Distance (Feet): 100 Feet Assistive device: Rolling walker (2 wheels) Gait Pattern/deviations: Step-through pattern, Decreased stride length       General Gait Details: Cues for safety, sequencing,  proper use of RW. Tolerated distance well. No LOB with RW.  Stairs            Wheelchair Mobility     Tilt Bed    Modified Rankin (Stroke Patients Only)       Balance Overall balance assessment: Mild deficits observed, not formally tested                                           Pertinent Vitals/Pain Pain Assessment Pain Assessment: 0-10 Pain Score: 3  Pain Location: R knee Pain Descriptors / Indicators: Discomfort, Aching Pain Intervention(s): Monitored during session, Ice applied, Repositioned    Home Living Family/patient expects to be discharged to:: Private residence Living Arrangements: Spouse/significant other Available Help at Discharge: Family Type of Home: House Home Access: Stairs to enter Entrance Stairs-Rails: None Secretary/administrator of Steps: 3   Home Layout: Able to live on main level with bedroom/bathroom Home Equipment: None      Prior Function Prior Level of Function : Independent/Modified Independent                     Extremity/Trunk Assessment   Upper Extremity Assessment Upper Extremity Assessment: Overall WFL for tasks assessed    Lower Extremity Assessment Lower Extremity Assessment: Generalized weakness    Cervical / Trunk Assessment Cervical / Trunk Assessment: Normal  Communication   Communication Communication: No apparent difficulties    Cognition Arousal: Alert Behavior During Therapy: WFL for tasks assessed/performed   PT - Cognitive impairments: No  apparent impairments                         Following commands: Intact       Cueing       General Comments      Exercises Total Joint Exercises Ankle Circles/Pumps: AROM, Both, 10 reps Quad Sets: AROM, 10 reps, Right Straight Leg Raises: AROM, Right, 10 reps Knee Flexion: AROM, 10 reps, Seated Goniometric ROM: ~10-90 degrees   Assessment/Plan    PT Assessment Patient needs continued PT services  PT Problem  List Decreased strength;Decreased range of motion;Decreased activity tolerance;Decreased balance;Decreased mobility;Decreased knowledge of use of DME;Pain       PT Treatment Interventions DME instruction;Gait training;Stair training;Functional mobility training;Therapeutic activities;Therapeutic exercise;Patient/family education;Balance training    PT Goals (Current goals can be found in the Care Plan section)  Acute Rehab PT Goals Patient Stated Goal: regain PLOF/independence PT Goal Formulation: With patient Time For Goal Achievement: 02/22/24 Potential to Achieve Goals: Good    Frequency 7X/week     Co-evaluation               AM-PAC PT "6 Clicks" Mobility  Outcome Measure Help needed turning from your back to your side while in a flat bed without using bedrails?: A Little Help needed moving from lying on your back to sitting on the side of a flat bed without using bedrails?: A Little Help needed moving to and from a bed to a chair (including a wheelchair)?: A Little Help needed standing up from a chair using your arms (e.g., wheelchair or bedside chair)?: A Little Help needed to walk in hospital room?: A Little Help needed climbing 3-5 steps with a railing? : A Little 6 Click Score: 18    End of Session Equipment Utilized During Treatment: Gait belt Activity Tolerance: Patient tolerated treatment well Patient left: in chair;with call bell/phone within reach   PT Visit Diagnosis: Pain;Other abnormalities of gait and mobility (R26.89) Pain - Right/Left: Right Pain - part of body: Knee    Time: 0910-0939 PT Time Calculation (min) (ACUTE ONLY): 29 min   Charges:   PT Evaluation $PT Eval Low Complexity: 1 Low PT Treatments $Gait Training: 8-22 mins PT General Charges $$ ACUTE PT VISIT: 1 Visit            Faye Ramsay, PT Acute Rehabilitation  Office: [phone]

## 2024-02-08 NOTE — TOC Transition Note (Signed)
Transition of Care [facility]) - Discharge Note   Patient Details  Name: Meghan Harrison MRN: [id number] Date of Birth: May 18, 1956  Transition of Care [facility]) CM/SW Contact:  Amada Jupiter, LCSW Phone Number: 02/08/2024, 9:48 AM   Clinical Narrative:     Met with pt who confirms she has received RW to room via [employer].  OPPT already arranged with [facility]).  No further TOC needs.  Final next level of care: OP Rehab Barriers to Discharge: No Barriers Identified   Patient Goals and CMS Choice Patient states their goals for this hospitalization and ongoing recovery are:: return home          Discharge Placement                       Discharge Plan and Services Additional resources added to the After Visit Summary for                  DME Arranged: Walker rolling DME Agency: [employer]                  Social Drivers of Health (SDOH) Interventions SDOH Screenings   Food Insecurity: No Food Insecurity (02/07/2024)  Housing: Low Risk  (02/07/2024)  Transportation Needs: No Transportation Needs (02/07/2024)  Utilities: Not At Risk (02/07/2024)  Financial Resource Strain: Low Risk  (09/07/2022)   [facility] to [facility] visits prior to 02/19/2023.  Physical Activity: Insufficiently Active (09/07/2022)   Received from [facility] visits prior to 02/19/2023., [facility] visits prior to 02/19/2023.  Social Connections: Socially Integrated (02/07/2024)  Stress: No Stress Concern Present (09/07/2022)   [facility] prior to 02/19/2023., [facility] visits prior to 02/19/2023.  Tobacco Use: Low Risk  (02/07/2024)     Readmission Risk Interventions     No data to display

## 2024-02-08 NOTE — Progress Notes (Signed)
   Subjective: 1 Day Post-Op Procedure(s) (LRB): TOTAL KNEE ARTHROPLASTY (Right) Patient reports pain as mild.   Patient seen in rounds with Dr. Charlann Boxer. Patient is well, and has had no acute complaints or problems. No acute events overnight. Foley catheter removed. Patient has not been up with PT.  We will start therapy today.   Objective: Vital signs in last 24 hours: Temp:  [97.5 F (36.4 C)-98.7 F (37.1 C)] 98.1 F (36.7 C) (02/19 0614) Pulse Rate:  [60-91] 72 (02/19 0614) Resp:  [13-22] 16 (02/19 0614) BP: (88-125)/(57-84) 95/64 (02/19 0614) SpO2:  [94 %-100 %] 98 % (02/19 0614) Weight:  [63.5 kg] 63.5 kg (02/18 1323)  Intake/Output from previous day:  Intake/Output Summary (Last 24 hours) at 02/08/2024 0737 Last data filed at 02/08/2024 5621 Gross per 24 hour  Intake 2200 ml  Output 530 ml  Net 1670 ml     Intake/Output this shift: No intake/output data recorded.  Labs: Recent Labs    02/08/24 0344  HGB 11.8*   Recent Labs    02/08/24 0344  WBC 10.4  RBC 3.63*  HCT 36.5  PLT 283   Recent Labs    02/08/24 0344  NA 136  K 4.3  CL 106  CO2 25  BUN 15  CREATININE 0.67  GLUCOSE 154*  CALCIUM 8.5*   No results for input(s): "LABPT", "INR" in the last 72 hours.  Exam: General - Patient is Alert and Oriented Extremity - Neurologically intact Sensation intact distally Intact pulses distally Dorsiflexion/Plantar flexion intact Dressing - dressing C/D/I Motor Function - intact, moving foot and toes well on exam.   Past Medical History:  Diagnosis Date   Arthritis    Pneumonia     Assessment/Plan: 1 Day Post-Op Procedure(s) (LRB): TOTAL KNEE ARTHROPLASTY (Right) Principal Problem:   S/P total knee arthroplasty, right  Estimated body mass index is 25.61 kg/m as calculated from the following:   Height as of this encounter: 5\' 2"  (1.575 m).   Weight as of this encounter: 63.5 kg. Advance diet Up with therapy   Patient's anticipated LOS is  less than 2 midnights, meeting these requirements: - Younger than 37 - Lives within 1 hour of care - Has a competent adult at home to recover with post-op recover - NO history of  - Chronic pain requiring opiods  - Diabetes  - Coronary Artery Disease  - Heart failure  - Heart attack  - Stroke  - DVT/VTE  - Cardiac arrhythmia  - Respiratory Failure/COPD  - Renal failure  - Anemia  - Advanced Liver disease     DVT Prophylaxis - Aspirin Weight bearing as tolerated.  Hgb stable at 11.8 this AM BP has been slightly low, so will give a bolus before PT  Plan is to go Home after hospital stay. Plan for discharge today following 1-2 sessions of PT as long as they are meeting their goals. Patient is scheduled for OPPT. Follow up in the office in 2 weeks.   Rosalene Billings, PA-C Orthopedic Surgery [phone] 02/08/2024, 7:37 AM

## 2024-02-08 NOTE — Progress Notes (Signed)
Physical Therapy Treatment Patient Details Name: Meghan Harrison MRN: [id number] DOB: 22-Jul-1956 Today's Date: 02/08/2024   History of Present Illness 68 yo female s/p R TKA 02/07/24    PT Comments  2nd session to practice stair negotiation. Pt continues to progress well. Plan is for OP PT f/u. PT education completed.    If plan is discharge home, recommend the following: A little help with walking and/or transfers;A little help with bathing/dressing/bathroom;Assistance with cooking/housework;Assist for transportation;Help with stairs or ramp for entrance   Can travel by private vehicle        Equipment Recommendations  Rolling walker (2 wheels);None recommended by PT    Recommendations for Other Services       Precautions / Restrictions Precautions Precautions: Fall;Knee Restrictions Weight Bearing Restrictions Per Provider Order: No Other Position/Activity Restrictions: WBAT     Mobility  Bed Mobility Overal bed mobility: Needs Assistance Bed Mobility: Supine to Sit          General bed mobility comments: oob in recliner    Transfers Overall transfer level: Needs assistance Equipment used: Rolling walker (2 wheels) Transfers: Sit to/from Stand Sit to Stand: Supervision           General transfer comment: Cues for safety, technique, hand/LE placement.    Ambulation/Gait Ambulation/Gait assistance: Supervision Gait Distance (Feet): 100 Feet Assistive device: Rolling walker (2 wheels) Gait Pattern/deviations: Step-through pattern, Decreased stride length       General Gait Details: Cues for safety, sequencing, proper use of RW. Tolerated distance well. No LOB with RW.   Stairs Stairs: Yes Stairs assistance: Min assist Stair Management: Forwards, With walker, Step to pattern Number of Stairs: 2 General stair comments: up and over portable stairs. cues for safety, technique, sequencing. Min A to stabilize RW.   Wheelchair Mobility     Tilt  Bed    Modified Rankin (Stroke Patients Only)       Balance Overall balance assessment: Needs assistance         Standing balance support: Reliant on assistive device for balance Standing balance-Leahy Scale: Fair                              Hotel manager: No apparent difficulties  Cognition Arousal: Alert Behavior During Therapy: WFL for tasks assessed/performed   PT - Cognitive impairments: No apparent impairments                         Following commands: Intact      Cueing    Exercises     General Comments        Pertinent Vitals/Pain Pain Assessment Pain Assessment: 0-10 Pain Score: 3  Pain Location: R knee Pain Descriptors / Indicators: Burning, Discomfort, Tightness Pain Intervention(s): Monitored during session, Repositioned    Home Living                          Prior Function            PT Goals (current goals can now be found in the care plan section) Acute Rehab PT Goals Patient Stated Goal: regain PLOF/independence PT Goal Formulation: With patient Time For Goal Achievement: 02/22/24 Potential to Achieve Goals: Good Progress towards PT goals: Progressing toward goals    Frequency    7X/week      PT Plan      Co-evaluation  AM-PAC PT "6 Clicks" Mobility   Outcome Measure  Help needed turning from your back to your side while in a flat bed without using bedrails?: A Little Help needed moving from lying on your back to sitting on the side of a flat bed without using bedrails?: A Little Help needed moving to and from a bed to a chair (including a wheelchair)?: A Little Help needed standing up from a chair using your arms (e.g., wheelchair or bedside chair)?: A Little Help needed to walk in hospital room?: A Little Help needed climbing 3-5 steps with a railing? : A Little 6 Click Score: 18    End of Session Equipment Utilized During Treatment:  Gait belt Activity Tolerance: Patient tolerated treatment well Patient left: in chair;with call bell/phone within reach   PT Visit Diagnosis: Pain;Other abnormalities of gait and mobility (R26.89) Pain - Right/Left: Right Pain - part of body: Knee     Time: 2841-3244 PT Time Calculation (min) (ACUTE ONLY): 18 min  Charges:    $Gait Training: 8-22 mins PT General Charges $$ ACUTE PT VISIT: 1 Visit                         Faye Ramsay, PT Acute Rehabilitation  Office: [phone]

## 2024-02-08 NOTE — Care Management Obs Status (Signed)
MEDICARE OBSERVATION STATUS NOTIFICATION   Patient Details  Name: Meghan Harrison MRN: [id number] Date of Birth: 01-13-1956   Medicare Observation Status Notification Given:  Hart Robinsons, LCSW 02/08/2024, 12:06 PM

## 2024-02-08 NOTE — Anesthesia Postprocedure Evaluation (Signed)
Anesthesia Post Note  Patient: Meghan Harrison  Procedure(s) Performed: TOTAL KNEE ARTHROPLASTY (Right: Knee)     Patient location during evaluation: PACU Anesthesia Type: Spinal Level of consciousness: awake, awake and alert and oriented Pain management: pain level controlled Vital Signs Assessment: post-procedure vital signs reviewed and stable Respiratory status: spontaneous breathing, nonlabored ventilation and respiratory function stable Cardiovascular status: blood pressure returned to baseline and stable Postop Assessment: no headache, no backache, spinal receding and no apparent nausea or vomiting Anesthetic complications: no   No notable events documented.  Last Vitals:  Vitals:   02/08/24 0213 02/08/24 0614  BP: 97/64 95/64  Pulse: 67 72  Resp: 16 16  Temp:  36.7 C  SpO2: 100% 98%    Last Pain:  Vitals:   02/08/24 0700  TempSrc:   PainSc: 2                  Collene Schlichter

## 2024-02-09 ENCOUNTER — Encounter (HOSPITAL_COMMUNITY): Payer: Self-pay | Admitting: Orthopedic Surgery

## 2024-02-09 NOTE — Progress Notes (Signed)
Patient ID: Meghan Harrison, female   DOB: August 14, 1956, 68 y.o.   MRN: [id number]  Procedure note: Date of procedure 02/07/2024  Preprocedure diagnosis: left knee osteoarthritis and pain  Post procedure diagnosis: Left knee osteoarthritis and pain  Procedure: The patient was identified in the PACU following her right total knee replacement.  We reviewed her desire to have her left knee injected at the time of her right knee replacement.  She wished to proceed in an effort to help with her postoperative recovery.  Her left knee was cleaned with Betadine.  I did infiltrate the subcutaneous tissue with quarter percent Marcaine.  We allowed this to set.  I then injected her left knee through a superior lateral portal site with 40 mg of Kenalog, 1 cc, and 3 cc of quarter percent Marcaine without epinephrine.  She tolerated the procedure well.  The site was dressed with a Band-Aid.

## 2024-02-21 NOTE — Discharge Summary (Signed)
 Patient ID: Meghan Harrison MRN: [id number] DOB/AGE: [birth year] 68 y.o.  Admit date: 02/07/2024 Discharge date: 02/08/2024  Admission Diagnoses:  Right knee osteoarthritis  Discharge Diagnoses:  Principal Problem:   S/P total knee arthroplasty, right   Past Medical History:  Diagnosis Date   Arthritis    Pneumonia     Surgeries: Procedure(s): TOTAL KNEE ARTHROPLASTY on 02/07/2024   Consultants:   Discharged Condition: Improved  Hospital Course: Meghan Harrison is an 68 y.o. female who was admitted 02/07/2024 for operative treatment ofS/P total knee arthroplasty, right. Patient has severe unremitting pain that affects sleep, daily activities, and work/hobbies. After pre-op clearance the patient was taken to the operating room on 02/07/2024 and underwent  Procedure(s): TOTAL KNEE ARTHROPLASTY.    Patient was given perioperative antibiotics:  Anti-infectives (From admission, onward)    Start     Dose/Rate Route Frequency Ordered Stop   02/08/24 0600  ceFAZolin (ANCEF) IVPB 2g/100 mL premix        2 g 200 mL/hr over 30 Minutes Intravenous On call to O.R. 02/07/24 1309 02/07/24 1459   02/07/24 2100  ceFAZolin (ANCEF) IVPB 2g/100 mL premix        2 g 200 mL/hr over 30 Minutes Intravenous Every 6 hours 02/07/24 1856 02/08/24 0239        Patient was given sequential compression devices, early ambulation, and chemoprophylaxis to prevent DVT. Patient worked with PT and was meeting their goals regarding safe ambulation and transfers.  Patient benefited maximally from hospital stay and there were no complications.    Recent vital signs: No data found.   Recent laboratory studies: No results for input(s): "WBC", "HGB", "HCT", "PLT", "NA", "K", "CL", "CO2", "BUN", "CREATININE", "GLUCOSE", "INR", "CALCIUM" in the last 72 hours.  Invalid input(s): "PT", "2"   Discharge Medications:   Allergies as of 02/08/2024       Reactions   Oxycodone-acetaminophen Itching, Other  (See Comments)   hallucinations   Sulfa Antibiotics    abd distress   Amoxicillin-pot Clavulanate Other (See Comments)   dizziness        Medication List     TAKE these medications    aspirin 81 MG chewable tablet Chew 1 tablet (81 mg total) by mouth 2 (two) times daily for 28 days.   CALCIUM 500 PO Take 2 tablets by mouth in the morning. [employer] BoneUp   celecoxib 200 MG capsule Commonly known as: CELEBREX Take 1 capsule (200 mg total) by mouth 2 (two) times daily.   diphenhydrAMINE 25 MG tablet Commonly known as: SOMINEX Take 25 mg by mouth at bedtime as needed for sleep.   ferrous sulfate 325 (65 FE) MG EC tablet Take 325 mg by mouth in the morning.   Magnesium 250 MG Tabs Take by mouth in the morning.   methocarbamol 500 MG tablet Commonly known as: ROBAXIN Take 1 tablet (500 mg total) by mouth every 6 (six) hours as needed for muscle spasms.   multivitamin with minerals Tabs tablet Take 1 tablet by mouth in the morning. Women's Multivitamin   omeprazole 20 MG capsule Commonly known as: PRILOSEC Take 20 mg by mouth daily before breakfast.   oxyCODONE 5 MG immediate release tablet Commonly known as: Oxy IR/ROXICODONE Take 1 tablet (5 mg total) by mouth every 4 (four) hours as needed for severe pain (pain score 7-10).   polyethylene glycol 17 g packet Commonly known as: MIRALAX / GLYCOLAX Take 17 g by mouth 2 (two) times daily.  Potassium 99 MG Tabs Take 99 mg by mouth in the morning.   PROBIOTIC PO Take 1 capsule by mouth in the morning.   rosuvastatin 10 MG tablet Commonly known as: CRESTOR Take 10 mg by mouth at bedtime.   senna 8.6 MG Tabs tablet Commonly known as: SENOKOT Take 2 tablets (17.2 mg total) by mouth at bedtime for 14 days.   triamcinolone cream 0.1 % Commonly known as: KENALOG Apply 1 Application topically 2 (two) times daily as needed (itchy/irritated skin (on back)).   Vitamin B-12 5000 MCG Tbdp Take 5,000 mcg by  mouth in the morning.   Vitamin D3 1.25 MG (50000 UT) Caps Take 50,000 Units by mouth every Thursday.   Wegovy 2.4 MG/0.75ML Soaj Generic drug: Semaglutide-Weight Management Inject 2.4 mg into the skin every Tuesday.               Discharge Care Instructions  (From admission, onward)           Start     Ordered   02/08/24 0000  Change dressing       Comments: Maintain surgical dressing until follow up in the clinic. If the edges start to pull up, may reinforce with tape. If the dressing is no longer working, may remove and cover with gauze and tape, but must keep the area dry and clean.  Call with any questions or concerns.   02/08/24 0740            Diagnostic Studies: No results found.  Disposition: Discharge disposition: 01-Home or Self Care       Discharge Instructions     Call MD / Call 911   Complete by: As directed    If you experience chest pain or shortness of breath, CALL 911 and be transported to the hospital emergency room.  If you develope a fever above 101 F, pus (white drainage) or increased drainage or redness at the wound, or calf pain, call your surgeon's office.   Change dressing   Complete by: As directed    Maintain surgical dressing until follow up in the clinic. If the edges start to pull up, may reinforce with tape. If the dressing is no longer working, may remove and cover with gauze and tape, but must keep the area dry and clean.  Call with any questions or concerns.   Constipation Prevention   Complete by: As directed    Drink plenty of fluids.  Prune juice may be helpful.  You may use a stool softener, such as Colace (over the counter) 100 mg twice a day.  Use MiraLax (over the counter) for constipation as needed.   Diet - low sodium heart healthy   Complete by: As directed    Increase activity slowly as tolerated   Complete by: As directed    Weight bearing as tolerated with assist device (walker, cane, etc) as directed, use it  as long as suggested by your surgeon or therapist, typically at least 4-6 weeks.   Post-operative opioid taper instructions:   Complete by: As directed    POST-OPERATIVE OPIOID TAPER INSTRUCTIONS: It is important to wean off of your opioid medication as soon as possible. If you do not need pain medication after your surgery it is ok to stop day one. Opioids include: Codeine, Hydrocodone(Norco, Vicodin), Oxycodone(Percocet, oxycontin) and hydromorphone amongst others.  Long term and even short term use of opiods can cause: Increased pain response Dependence Constipation Depression Respiratory depression And more.  Withdrawal symptoms  can include Flu like symptoms Nausea, vomiting And more Techniques to manage these symptoms Hydrate well Eat regular healthy meals Stay active Use relaxation techniques(deep breathing, meditating, yoga) Do Not substitute Alcohol to help with tapering If you have been on opioids for less than two weeks and do not have pain than it is ok to stop all together.  Plan to wean off of opioids This plan should start within one week post op of your joint replacement. Maintain the same interval or time between taking each dose and first decrease the dose.  Cut the total daily intake of opioids by one tablet each day Next start to increase the time between doses. The last dose that should be eliminated is the evening dose.      TED hose   Complete by: As directed    Use stockings (TED hose) for 2 weeks on both leg(s).  You may remove them at night for sleeping.        Follow-up Information     Durene Romans, MD. Schedule an appointment as soon as possible for a visit in 2 week(s).   Specialty: Orthopedic Surgery Contact information: [address] [place] [address] [phone]                  Signed: Cassandria Anger 02/21/2024, 4:10 PM

## 2024-04-13 ENCOUNTER — Ambulatory Visit
Admission: RE | Admit: 2024-04-13 | Discharge: 2024-04-13 | Disposition: A | Discharge status: Home / Self Care | Payer: Federal, State, Local not specified - PPO | Source: Ambulatory Visit | Attending: Family Medicine | Admitting: Family Medicine

## 2024-04-13 DIAGNOSIS — E2839 Other primary ovarian failure: Secondary | ICD-10-CM

## 2024-11-01 ENCOUNTER — Other Ambulatory Visit: Payer: Self-pay | Admitting: Family Medicine

## 2024-11-01 DIAGNOSIS — Z1231 Encounter for screening mammogram for malignant neoplasm of breast: Secondary | ICD-10-CM

## 2024-11-30 ENCOUNTER — Ambulatory Visit
Admission: RE | Admit: 2024-11-30 | Discharge: 2024-11-30 | Disposition: A | Source: Ambulatory Visit | Attending: Family Medicine | Admitting: Family Medicine

## 2024-11-30 DIAGNOSIS — Z1231 Encounter for screening mammogram for malignant neoplasm of breast: Secondary | ICD-10-CM

## 2025-01-08 ENCOUNTER — Encounter (HOSPITAL_COMMUNITY): Payer: Self-pay

## 2025-01-08 NOTE — Patient Instructions (Addendum)
 SURGICAL WAITING ROOM VISITATION Patients having surgery or a procedure may have no more than 2 support people in the waiting area - these visitors may rotate.    Children under the age of 85 will not be allowed to visit due to the increase in respiratory illness  Children under the age of 76 must have an adult with them who is not the patient.  If the patient needs to stay at the hospital during part of their recovery, the visitor guidelines for inpatient rooms apply. Pre-op nurse will coordinate an appropriate time for 1 support person to accompany patient in pre-op.  This support person may not rotate.    Please refer to the [facility] website for the visitor guidelines for Inpatients (after your surgery is over and you are in a regular room).       Your procedure is scheduled on: 01-22-25    Report to [facility] Main Entrance    Report to admitting at 7:15 AM   Call this number if you have problems the morning of surgery [phone]   Do not eat food :After Midnight.   After Midnight you may have the following liquids until 6:45 AM DAY OF SURGERY  Water  Non-Citrus Juices (without pulp, NO RED-Apple, White grape, White cranberry) Black Coffee (NO MILK/CREAM OR CREAMERS, sugar ok)  Clear Tea (NO MILK/CREAM OR CREAMERS, sugar ok) regular and decaf                             Plain Jell-O (NO RED)                                           Fruit ices (not with fruit pulp, NO RED)                                     Popsicles (NO RED)                                                               Sports drinks like Gatorade (NO RED)                   The day of surgery:  Drink ONE (1) Pre-Surgery Clear Ensure by 6:45 AM the morning of surgery. Drink in one sitting. Do not sip.  This drink was given to you during your hospital  pre-op appointment visit. Nothing else to drink after completing the Pre-Surgery Clear Ensure.          If you have questions, please contact  your surgeons office.   FOLLOW  ANY ADDITIONAL PRE OP INSTRUCTIONS YOU RECEIVED FROM YOUR SURGEON'S OFFICE!!!     Oral Hygiene is also important to reduce your risk of infection.                                    Remember - BRUSH YOUR TEETH THE MORNING OF SURGERY WITH YOUR REGULAR TOOTHPASTE   Do NOT smoke after Midnight  Take these medicines the morning of surgery with A SIP OF WATER :    Omeprazole (Prilosec)  Stop all vitamins and herbal supplements 7 days before surgery  Bring CPAP mask and tubing day of surgery.                              You may not have any metal on your body including hair pins, jewelry, and body piercing             Do not wear make-up, lotions, powders, perfumes or deodorant  Do not wear nail polish including gel and S&S, artificial/acrylic nails, or any other type of covering on natural nails including finger and toenails. If you have artificial nails, gel coating, etc. that needs to be removed by a nail salon please have this removed prior to surgery or surgery may need to be canceled/ delayed if the surgeon/ anesthesia feels like they are unable to be safely monitored.   Do not shave  48 hours prior to surgery.    Do not bring valuables to the hospital. [facility] IS NOT RESPONSIBLE   FOR VALUABLES.   Contacts, dentures or bridgework may not be worn into surgery.   Bring small overnight bag day of surgery.   DO NOT BRING YOUR HOME MEDICATIONS TO THE HOSPITAL. PHARMACY WILL DISPENSE MEDICATIONS LISTED ON YOUR MEDICATION LIST TO YOU DURING YOUR ADMISSION IN THE HOSPITAL!   Special Instructions: Bring a copy of your healthcare power of attorney and living will documents the day of surgery if you haven't scanned them before.              Please read over the following fact sheets you were given: IF YOU HAVE QUESTIONS ABOUT YOUR PRE-OP INSTRUCTIONS PLEASE CALL [phone] Gwen  If you received a COVID test during your pre-op visit  it is requested  that you wear a mask when out in public, stay away from anyone that may not be feeling well and notify your surgeon if you develop symptoms. If you test positive for Covid or have been in contact with anyone that has tested positive in the last 10 days please notify you surgeon.   Pre-operative 4 CHG Bath Instructions  DYNA-Hex 4 Chlorhexidine  Gluconate 4% Solution Antiseptic 4 fl. oz   You can play a key role in reducing the risk of infection after surgery. Your skin needs to be as free of germs as possible. You can reduce the number of germs on your skin by washing with CHG (chlorhexidine  gluconate) soap before surgery. CHG is an antiseptic soap that kills germs and continues to kill germs even after washing.   DO NOT use if you have an allergy to chlorhexidine /CHG or antibacterial soaps. If your skin becomes reddened or irritated, stop using the CHG and notify one of our RNs at   Please shower with the CHG soap starting 4 days before surgery using the following schedule:     Please keep in mind the following:  DO NOT shave, including legs and underarms, starting the day of your first shower.   You may shave your face at any point before/day of surgery.  Place clean sheets on your bed the day you start using CHG soap. Use a clean washcloth (not used since being washed) for each shower. DO NOT sleep with pets once you start using the CHG.  CHG Shower Instructions:  If you choose to wash your hair  and private area, wash first with your normal shampoo/soap.  After you use shampoo/soap, rinse your hair and body thoroughly to remove shampoo/soap residue.  Turn the water  OFF and apply about 3 tablespoons (45 ml) of CHG soap to a CLEAN washcloth.  Apply CHG soap ONLY FROM YOUR NECK DOWN TO YOUR TOES (washing for 3-5 minutes)  DO NOT use CHG soap on face, private areas, open wounds, or sores.  Pay special attention to the area where your surgery is being performed.  If you are having back  surgery, having someone wash your back for you may be helpful. Wait 2 minutes after CHG soap is applied, then you may rinse off the CHG soap.  Pat dry with a clean towel  Put on clean clothes/pajamas   If you choose to wear lotion, please use ONLY the CHG-compatible lotions on the back of this paper.     Additional instructions for the day of surgery:  Shower with regular soap the day of surgery DO NOT APPLY any lotions, deodorants, cologne, or perfumes.   Put on clean/comfortable clothes.  Brush your teeth.  Ask your nurse before applying any prescription medications to the skin.   CHG Compatible Lotions   Aveeno Moisturizing lotion  Cetaphil Moisturizing Cream  Cetaphil Moisturizing Lotion  Clairol Herbal Essence Moisturizing Lotion, Dry Skin  Clairol Herbal Essence Moisturizing Lotion, Extra Dry Skin  Clairol Herbal Essence Moisturizing Lotion, Normal Skin  Curel Age Defying Therapeutic Moisturizing Lotion with Alpha Hydroxy  Curel Extreme Care Body Lotion  Curel Soothing Hands Moisturizing Hand Lotion  Curel Therapeutic Moisturizing Cream, Fragrance-Free  Curel Therapeutic Moisturizing Lotion, Fragrance-Free  Curel Therapeutic Moisturizing Lotion, Original Formula  Eucerin Daily Replenishing Lotion  Eucerin Dry Skin Therapy Plus Alpha Hydroxy Crme  Eucerin Dry Skin Therapy Plus Alpha Hydroxy Lotion  Eucerin Original Crme  Eucerin Original Lotion  Eucerin Plus Crme Eucerin Plus Lotion  Eucerin TriLipid Replenishing Lotion  Keri Anti-Bacterial Hand Lotion  Keri Deep Conditioning Original Lotion Dry Skin Formula Softly Scented  Keri Deep Conditioning Original Lotion, Fragrance Free Sensitive Skin Formula  Keri Lotion Fast Absorbing Fragrance Free Sensitive Skin Formula  Keri Lotion Fast Absorbing Softly Scented Dry Skin Formula  Keri Original Lotion  Keri Skin Renewal Lotion Keri Silky Smooth Lotion  Keri Silky Smooth Sensitive Skin Lotion  Nivea Body Creamy  Conditioning Oil  Nivea Body Extra Enriched Lotion  Nivea Body Original Lotion  Nivea Body Sheer Moisturizing Lotion Nivea Crme  Nivea Skin Firming Lotion  NutraDerm 30 Skin Lotion  NutraDerm Skin Lotion  NutraDerm Therapeutic Skin Cream  NutraDerm Therapeutic Skin Lotion  ProShield Protective Hand Cream  Provon moisturizing lotion   PATIENT SIGNATURE_________________________________  NURSE SIGNATURE__________________________________  ________________________________________________________________________    Meghan Harrison  An incentive spirometer is a tool that can help keep your lungs clear and active. This tool measures how well you are filling your lungs with each breath. Taking long deep breaths may help reverse or decrease the chance of developing breathing (pulmonary) problems (especially infection) following: A long period of time when you are unable to move or be active. BEFORE THE PROCEDURE  If the spirometer includes an indicator to show your best effort, your nurse or respiratory therapist will set it to a desired goal. If possible, sit up straight or lean slightly forward. Try not to slouch. Hold the incentive spirometer in an upright position. INSTRUCTIONS FOR USE  Sit on the edge of your bed if possible, or sit up as far as  you can in bed or on a chair. Hold the incentive spirometer in an upright position. Breathe out normally. Place the mouthpiece in your mouth and seal your lips tightly around it. Breathe in slowly and as deeply as possible, raising the piston or the ball toward the top of the column. Hold your breath for 3-5 seconds or for as long as possible. Allow the piston or ball to fall to the bottom of the column. Remove the mouthpiece from your mouth and breathe out normally. Rest for a few seconds and repeat Steps 1 through 7 at least 10 times every 1-2 hours when you are awake. Take your time and take a few normal breaths between deep  breaths. The spirometer may include an indicator to show your best effort. Use the indicator as a goal to work toward during each repetition. After each set of 10 deep breaths, practice coughing to be sure your lungs are clear. If you have an incision (the cut made at the time of surgery), support your incision when coughing by placing a pillow or rolled up towels firmly against it. Once you are able to get out of bed, walk around indoors and cough well. You may stop using the incentive spirometer when instructed by your caregiver.  RISKS AND COMPLICATIONS Take your time so you do not get dizzy or light-headed. If you are in pain, you may need to take or ask for pain medication before doing incentive spirometry. It is harder to take a deep breath if you are having pain. AFTER USE Rest and breathe slowly and easily. It can be helpful to keep track of a log of your progress. Your caregiver can provide you with a simple table to help with this. If you are using the spirometer at home, follow these instructions: SEEK MEDICAL CARE IF:  You are having difficultly using the spirometer. You have trouble using the spirometer as often as instructed. Your pain medication is not giving enough relief while using the spirometer. You develop fever of 100.5 F (38.1 C) or higher. SEEK IMMEDIATE MEDICAL CARE IF:  You cough up bloody sputum that had not been present before. You develop fever of 102 F (38.9 C) or greater. You develop worsening pain at or near the incision site. MAKE SURE YOU:  Understand these instructions. Will watch your condition. Will get help right away if you are not doing well or get worse. Document Released: 04/18/2007 Document Revised: 02/28/2012 Document Reviewed: 06/19/2007 [facility] Patient Information 2014 [place], [place].

## 2025-01-10 ENCOUNTER — Encounter (HOSPITAL_COMMUNITY): Payer: Self-pay

## 2025-01-10 ENCOUNTER — Other Ambulatory Visit: Payer: Self-pay

## 2025-01-10 ENCOUNTER — Encounter (HOSPITAL_COMMUNITY)
Admission: RE | Admit: 2025-01-10 | Discharge: 2025-01-10 | Disposition: A | Discharge status: Home / Self Care | Source: Ambulatory Visit | Attending: Orthopedic Surgery | Admitting: Orthopedic Surgery

## 2025-01-10 VITALS — BP 111/75 | HR 87 | Temp 98.6°F | Resp 12 | Ht 63.0 in | Wt 143.2 lb

## 2025-01-10 DIAGNOSIS — Z01812 Encounter for preprocedural laboratory examination: Secondary | ICD-10-CM | POA: Insufficient documentation

## 2025-01-10 DIAGNOSIS — Z01818 Encounter for other preprocedural examination: Secondary | ICD-10-CM

## 2025-01-10 HISTORY — DX: Gastro-esophageal reflux disease without esophagitis: K21.9

## 2025-01-10 HISTORY — DX: Hyperlipidemia, unspecified: E78.5

## 2025-01-10 LAB — CBC
HCT: 39.8 % (ref 36.0–46.0)
Hemoglobin: 13.2 g/dL (ref 12.0–15.0)
MCH: 33.2 pg (ref 26.0–34.0)
MCHC: 33.2 g/dL (ref 30.0–36.0)
MCV: 100.3 fL — ABNORMAL HIGH (ref 80.0–100.0)
Platelets: 322 K/uL (ref 150–400)
RBC: 3.97 MIL/uL (ref 3.87–5.11)
RDW: 12.4 % (ref 11.5–15.5)
WBC: 7.1 K/uL (ref 4.0–10.5)
nRBC: 0 % (ref 0.0–0.2)

## 2025-01-10 LAB — SURGICAL PCR SCREEN
MRSA, PCR: NEGATIVE
Staphylococcus aureus: POSITIVE — AB

## 2025-01-10 NOTE — Progress Notes (Addendum)
 Date of COVID positive in last 90 days:  No  PCP - Josette Aho, PA-C (medical clearance in media) Cardiologist - N/A  Chest x-ray - N/A EKG - N/A Stress Test - N/A ECHO - N/A Cardiac Cath - N/A Pacemaker/ICD device last checked:N/A Spinal Cord Stimulator:N/A  Bowel Prep - N/A  Sleep Study - Yes, +sleep apnea CPAP - No longer needs due to weight loss  Fasting Blood Sugar - N/A Checks Blood Sugar _____ times a day  Wegovy (for weight loss) Last dose of GLP1 agonist-  01-05-25 (will not restart prior to surgery) GLP1 instructions:  Do not take after     Last dose of SGLT-2 inhibitors-  N/A SGLT-2 instructions:  Do not take after    Blood Thinner Instructions: N/A Last dose:   Time: Aspirin  Instructions:N/A Last Dose:  Activity level:  Can go up a flight of stairs and perform activities of daily living without stopping and without symptoms of chest pain or shortness of breath.  Anesthesia review: N/A  Patient denies shortness of breath, fever, cough and chest pain at PAT appointment  Patient verbalized understanding of instructions that were given to them at the PAT appointment. Patient was also instructed that they will need to review over the PAT instructions again at home before surgery.

## 2025-01-10 NOTE — Progress Notes (Signed)
PCR results sent to Dr. Olin to review.   

## 2025-01-21 NOTE — Anesthesia Preprocedure Evaluation (Signed)
"                                    Anesthesia Evaluation  Patient identified by MRN, date of birth, ID band Patient awake    Reviewed: Allergy & Precautions, NPO status , Patient's Chart, lab work & pertinent test results  History of Anesthesia Complications Negative for: history of anesthetic complications  Airway Mallampati: II  TM Distance: >3 FB Neck ROM: Full    Dental no notable dental hx.    Pulmonary neg pulmonary ROS   Pulmonary exam normal        Cardiovascular negative cardio ROS Normal cardiovascular exam     Neuro/Psych negative neurological ROS     GI/Hepatic Neg liver ROS,GERD  Medicated,,  Endo/Other  On Wegovy  Renal/GU negative Renal ROS     Musculoskeletal  (+) Arthritis ,    Abdominal   Peds  Hematology negative hematology ROS (+)   Anesthesia Other Findings   Reproductive/Obstetrics                              Anesthesia Physical Anesthesia Plan  ASA: 2  Anesthesia Plan: Spinal   Post-op Pain Management: Tylenol  PO (pre-op)* and Regional block*   Induction:   PONV Risk Score and Plan: 3 and Treatment may vary due to age or medical condition, Ondansetron , Propofol  infusion, Dexamethasone  and Midazolam   Airway Management Planned: Natural Airway and Simple Face Mask  Additional Equipment: None  Intra-op Plan:   Post-operative Plan:   Informed Consent: I have reviewed the patients History and Physical, chart, labs and discussed the procedure including the risks, benefits and alternatives for the proposed anesthesia with the patient or authorized representative who has indicated his/her understanding and acceptance.       Plan Discussed with: CRNA  Anesthesia Plan Comments:          Anesthesia Quick Evaluation  "

## 2025-01-22 ENCOUNTER — Other Ambulatory Visit: Payer: Self-pay

## 2025-01-22 ENCOUNTER — Encounter (HOSPITAL_COMMUNITY): Payer: Self-pay | Admitting: Anesthesiology

## 2025-01-22 ENCOUNTER — Encounter (HOSPITAL_COMMUNITY): Payer: Self-pay | Admitting: Orthopedic Surgery

## 2025-01-22 ENCOUNTER — Observation Stay (HOSPITAL_COMMUNITY)
Admission: RE | Admit: 2025-01-22 | Discharge: 2025-01-23 | Disposition: A | Source: Ambulatory Visit | Attending: Orthopedic Surgery | Admitting: Orthopedic Surgery

## 2025-01-22 ENCOUNTER — Encounter (HOSPITAL_COMMUNITY): Admission: RE | Disposition: A | Payer: Self-pay | Source: Ambulatory Visit | Attending: Orthopedic Surgery

## 2025-01-22 DIAGNOSIS — M1712 Unilateral primary osteoarthritis, left knee: Secondary | ICD-10-CM | POA: Diagnosis not present

## 2025-01-22 DIAGNOSIS — Z96652 Presence of left artificial knee joint: Principal | ICD-10-CM

## 2025-01-22 MED ORDER — LACTATED RINGERS IV SOLN: INTRAVENOUS | Status: DC

## 2025-01-22 MED ORDER — BISACODYL 10 MG RE SUPP: 10.0000 mg | Freq: Every day | RECTAL | Status: DC | PRN

## 2025-01-22 MED ORDER — PHENYLEPHRINE 80 MCG/ML (10ML) SYRINGE FOR IV PUSH (FOR BLOOD PRESSURE SUPPORT)
PREFILLED_SYRINGE | INTRAVENOUS | Status: AC
  Filled 2025-01-22: qty 10

## 2025-01-22 MED ORDER — PHENYLEPHRINE HCL-NACL 20-0.9 MG/250ML-% IV SOLN
INTRAVENOUS | Status: DC | PRN
  Administered 2025-01-22: 50 ug/min via INTRAVENOUS

## 2025-01-22 MED ORDER — MENTHOL 3 MG MT LOZG: 1.0000 | LOZENGE | OROMUCOSAL | Status: DC | PRN

## 2025-01-22 MED ORDER — STERILE WATER FOR IRRIGATION IR SOLN
Status: DC | PRN
  Administered 2025-01-22: 2000 mL

## 2025-01-22 MED ORDER — ONDANSETRON HCL 4 MG/2ML IJ SOLN
INTRAMUSCULAR | Status: DC | PRN
  Administered 2025-01-22: 4 mg via INTRAVENOUS

## 2025-01-22 MED ORDER — METOCLOPRAMIDE HCL 5 MG PO TABS: 5.0000 mg | ORAL_TABLET | Freq: Three times a day (TID) | ORAL | Status: DC | PRN

## 2025-01-22 MED ORDER — MEPIVACAINE HCL (PF) 2 % IJ SOLN
INTRAMUSCULAR | Status: AC
  Filled 2025-01-22: qty 20

## 2025-01-22 MED ORDER — KETOROLAC TROMETHAMINE 30 MG/ML IJ SOLN
INTRAMUSCULAR | Status: AC
  Filled 2025-01-22: qty 1

## 2025-01-22 MED ORDER — BUPIVACAINE-EPINEPHRINE (PF) 0.25% -1:200000 IJ SOLN
INTRAMUSCULAR | Status: AC
  Filled 2025-01-22: qty 30

## 2025-01-22 MED ORDER — HYDROCODONE-ACETAMINOPHEN 5-325 MG PO TABS
1.0000 | ORAL_TABLET | ORAL | Status: DC | PRN
  Administered 2025-01-22: 1 via ORAL
  Administered 2025-01-22 – 2025-01-23 (×3): 2 via ORAL
  Filled 2025-01-22 (×3): qty 2
  Filled 2025-01-22: qty 1

## 2025-01-22 MED ORDER — ONDANSETRON HCL 4 MG/2ML IJ SOLN
INTRAMUSCULAR | Status: AC
  Filled 2025-01-22: qty 4

## 2025-01-22 MED ORDER — FENTANYL CITRATE (PF) 100 MCG/2ML IJ SOLN
INTRAMUSCULAR | Status: AC
  Filled 2025-01-22: qty 2

## 2025-01-22 MED ORDER — FERROUS SULFATE 325 (65 FE) MG PO TABS
325.0000 mg | ORAL_TABLET | Freq: Every morning | ORAL | Status: DC
  Administered 2025-01-23: 325 mg via ORAL
  Filled 2025-01-22: qty 1

## 2025-01-22 MED ORDER — ONDANSETRON HCL 4 MG/2ML IJ SOLN
INTRAMUSCULAR | Status: AC
  Filled 2025-01-22: qty 2

## 2025-01-22 MED ORDER — ALUM & MAG HYDROXIDE-SIMETH 200-200-20 MG/5ML PO SUSP: 30.0000 mL | ORAL | Status: DC | PRN

## 2025-01-22 MED ORDER — BUPIVACAINE IN DEXTROSE 0.75-8.25 % IT SOLN
INTRATHECAL | Status: DC | PRN
  Administered 2025-01-22: 1.6 mL via INTRATHECAL

## 2025-01-22 MED ORDER — ROSUVASTATIN CALCIUM 10 MG PO TABS
10.0000 mg | ORAL_TABLET | Freq: Every day | ORAL | Status: DC
  Administered 2025-01-22: 10 mg via ORAL
  Filled 2025-01-22: qty 1

## 2025-01-22 MED ORDER — DEXAMETHASONE SOD PHOSPHATE PF 10 MG/ML IJ SOLN
10.0000 mg | Freq: Once | INTRAMUSCULAR | Status: AC
  Administered 2025-01-23: 10 mg via INTRAVENOUS
  Filled 2025-01-22: qty 1

## 2025-01-22 MED ORDER — SODIUM CHLORIDE 0.9 % IR SOLN
Status: DC | PRN
  Administered 2025-01-22: 1000 mL

## 2025-01-22 MED ORDER — ORAL CARE MOUTH RINSE: 15.0000 mL | Freq: Once | OROMUCOSAL | Status: AC

## 2025-01-22 MED ORDER — PANTOPRAZOLE SODIUM 40 MG PO TBEC
40.0000 mg | DELAYED_RELEASE_TABLET | Freq: Every day | ORAL | Status: DC
  Administered 2025-01-23: 40 mg via ORAL
  Filled 2025-01-22: qty 1

## 2025-01-22 MED ORDER — MIDAZOLAM HCL (PF) 2 MG/2ML IJ SOLN
2.0000 mg | Freq: Once | INTRAMUSCULAR | Status: AC
  Administered 2025-01-22: 1 mg via INTRAVENOUS
  Filled 2025-01-22: qty 2

## 2025-01-22 MED ORDER — METOCLOPRAMIDE HCL 5 MG/ML IJ SOLN: 5.0000 mg | Freq: Three times a day (TID) | INTRAMUSCULAR | Status: DC | PRN

## 2025-01-22 MED ORDER — FENTANYL CITRATE (PF) 50 MCG/ML IJ SOSY
25.0000 ug | PREFILLED_SYRINGE | INTRAMUSCULAR | Status: DC | PRN
  Administered 2025-01-22: 50 ug via INTRAVENOUS

## 2025-01-22 MED ORDER — PHENOL 1.4 % MT LIQD: 1.0000 | OROMUCOSAL | Status: DC | PRN

## 2025-01-22 MED ORDER — DIPHENHYDRAMINE HCL 12.5 MG/5ML PO ELIX: 12.5000 mg | ORAL_SOLUTION | ORAL | Status: DC | PRN

## 2025-01-22 MED ORDER — PROPOFOL 10 MG/ML IV BOLUS
INTRAVENOUS | Status: DC | PRN
  Administered 2025-01-22: 20 mg via INTRAVENOUS

## 2025-01-22 MED ORDER — TRANEXAMIC ACID-NACL 1000-0.7 MG/100ML-% IV SOLN
1000.0000 mg | INTRAVENOUS | Status: AC
  Administered 2025-01-22: 1000 mg via INTRAVENOUS
  Filled 2025-01-22: qty 100

## 2025-01-22 MED ORDER — TRAMADOL HCL 50 MG PO TABS
50.0000 mg | ORAL_TABLET | Freq: Four times a day (QID) | ORAL | Status: DC | PRN
  Administered 2025-01-23: 50 mg via ORAL
  Filled 2025-01-22: qty 1

## 2025-01-22 MED ORDER — PROPOFOL 500 MG/50ML IV EMUL
INTRAVENOUS | Status: DC | PRN
  Administered 2025-01-22: 150 ug/kg/min via INTRAVENOUS

## 2025-01-22 MED ORDER — POTASSIUM 99 MG PO TABS: 99.0000 mg | ORAL_TABLET | Freq: Every morning | ORAL | Status: DC

## 2025-01-22 MED ORDER — PROPOFOL 1000 MG/100ML IV EMUL
INTRAVENOUS | Status: AC
  Filled 2025-01-22: qty 100

## 2025-01-22 MED ORDER — ACETAMINOPHEN 325 MG PO TABS
325.0000 mg | ORAL_TABLET | Freq: Four times a day (QID) | ORAL | Status: DC | PRN
  Administered 2025-01-23: 650 mg via ORAL
  Filled 2025-01-22: qty 2

## 2025-01-22 MED ORDER — TRANEXAMIC ACID-NACL 1000-0.7 MG/100ML-% IV SOLN
1000.0000 mg | Freq: Once | INTRAVENOUS | Status: AC
  Administered 2025-01-22: 1000 mg via INTRAVENOUS
  Filled 2025-01-22: qty 100

## 2025-01-22 MED ORDER — FENTANYL CITRATE (PF) 50 MCG/ML IJ SOSY
PREFILLED_SYRINGE | INTRAMUSCULAR | Status: AC
  Filled 2025-01-22: qty 1

## 2025-01-22 MED ORDER — ONDANSETRON HCL 4 MG PO TABS: 4.0000 mg | ORAL_TABLET | Freq: Four times a day (QID) | ORAL | Status: DC | PRN

## 2025-01-22 MED ORDER — METHOCARBAMOL 1000 MG/10ML IJ SOLN: 500.0000 mg | Freq: Four times a day (QID) | INTRAMUSCULAR | Status: DC | PRN

## 2025-01-22 MED ORDER — CHLORHEXIDINE GLUCONATE 0.12 % MT SOLN
15.0000 mL | Freq: Once | OROMUCOSAL | Status: AC
  Administered 2025-01-22: 15 mL via OROMUCOSAL

## 2025-01-22 MED ORDER — MORPHINE SULFATE (PF) 2 MG/ML IV SOLN: 0.5000 mg | INTRAVENOUS | Status: DC | PRN

## 2025-01-22 MED ORDER — SODIUM CHLORIDE (PF) 0.9 % IJ SOLN
INTRAMUSCULAR | Status: AC
  Filled 2025-01-22: qty 30

## 2025-01-22 MED ORDER — 0.9 % SODIUM CHLORIDE (POUR BTL) OPTIME
TOPICAL | Status: DC | PRN
  Administered 2025-01-22: 1000 mL

## 2025-01-22 MED ORDER — ASPIRIN 81 MG PO CHEW
81.0000 mg | CHEWABLE_TABLET | Freq: Two times a day (BID) | ORAL | Status: DC
  Administered 2025-01-22 – 2025-01-23 (×2): 81 mg via ORAL
  Filled 2025-01-22 (×2): qty 1

## 2025-01-22 MED ORDER — DROPERIDOL 2.5 MG/ML IJ SOLN: 0.6250 mg | Freq: Once | INTRAMUSCULAR | Status: DC | PRN

## 2025-01-22 MED ORDER — ONDANSETRON HCL 4 MG/2ML IJ SOLN: 4.0000 mg | Freq: Four times a day (QID) | INTRAMUSCULAR | Status: DC | PRN

## 2025-01-22 MED ORDER — CLONIDINE HCL (ANALGESIA) 100 MCG/ML EP SOLN
EPIDURAL | Status: DC | PRN
  Administered 2025-01-22: 100 ug

## 2025-01-22 MED ORDER — DEXAMETHASONE SOD PHOSPHATE PF 10 MG/ML IJ SOLN
8.0000 mg | Freq: Once | INTRAMUSCULAR | Status: AC
  Administered 2025-01-22: 5 mg via INTRAVENOUS

## 2025-01-22 MED ORDER — SODIUM CHLORIDE (PF) 0.9 % IJ SOLN
INTRAMUSCULAR | Status: DC | PRN
  Administered 2025-01-22: 61 mL

## 2025-01-22 MED ORDER — FENTANYL CITRATE (PF) 50 MCG/ML IJ SOSY
100.0000 ug | PREFILLED_SYRINGE | Freq: Once | INTRAMUSCULAR | Status: AC
  Administered 2025-01-22: 50 ug via INTRAVENOUS
  Filled 2025-01-22: qty 2

## 2025-01-22 MED ORDER — POLYETHYLENE GLYCOL 3350 17 G PO PACK
17.0000 g | PACK | Freq: Two times a day (BID) | ORAL | Status: DC
  Administered 2025-01-23: 17 g via ORAL
  Filled 2025-01-22 (×2): qty 1

## 2025-01-22 MED ORDER — MIDAZOLAM HCL 5 MG/5ML IJ SOLN
INTRAMUSCULAR | Status: DC | PRN
  Administered 2025-01-22: 1 mg via INTRAVENOUS

## 2025-01-22 MED ORDER — SODIUM CHLORIDE 0.9 % IV SOLN: INTRAVENOUS | Status: DC

## 2025-01-22 MED ORDER — SENNA 8.6 MG PO TABS
2.0000 | ORAL_TABLET | Freq: Every day | ORAL | Status: DC
  Administered 2025-01-22: 17.2 mg via ORAL
  Filled 2025-01-22: qty 2

## 2025-01-22 MED ORDER — BUPIVACAINE-EPINEPHRINE (PF) 0.5% -1:200000 IJ SOLN
INTRAMUSCULAR | Status: DC | PRN
  Administered 2025-01-22: 15 mL via PERINEURAL

## 2025-01-22 MED ORDER — FENTANYL CITRATE (PF) 100 MCG/2ML IJ SOLN
INTRAMUSCULAR | Status: DC | PRN
  Administered 2025-01-22: 50 ug via INTRAVENOUS

## 2025-01-22 MED ORDER — CEFAZOLIN SODIUM-DEXTROSE 2-4 GM/100ML-% IV SOLN
2.0000 g | INTRAVENOUS | Status: AC
  Administered 2025-01-22: 2 g via INTRAVENOUS
  Filled 2025-01-22: qty 100

## 2025-01-22 MED ORDER — POVIDONE-IODINE 10 % EX SWAB
2.0000 | Freq: Once | CUTANEOUS | Status: AC
  Administered 2025-01-22: 2 via TOPICAL

## 2025-01-22 MED ORDER — PHENYLEPHRINE 80 MCG/ML (10ML) SYRINGE FOR IV PUSH (FOR BLOOD PRESSURE SUPPORT)
PREFILLED_SYRINGE | INTRAVENOUS | Status: DC | PRN
  Administered 2025-01-22: 160 ug via INTRAVENOUS

## 2025-01-22 MED ORDER — ACETAMINOPHEN 500 MG PO TABS
1000.0000 mg | ORAL_TABLET | Freq: Once | ORAL | Status: AC
  Administered 2025-01-22: 1000 mg via ORAL
  Filled 2025-01-22: qty 2

## 2025-01-22 MED ORDER — METHOCARBAMOL 500 MG PO TABS
500.0000 mg | ORAL_TABLET | Freq: Four times a day (QID) | ORAL | Status: DC | PRN
  Administered 2025-01-23 (×2): 500 mg via ORAL
  Filled 2025-01-22 (×2): qty 1

## 2025-01-22 MED ORDER — CELECOXIB 200 MG PO CAPS
200.0000 mg | ORAL_CAPSULE | Freq: Two times a day (BID) | ORAL | Status: DC
  Administered 2025-01-22: 200 mg via ORAL
  Filled 2025-01-22: qty 1

## 2025-01-22 MED ORDER — MIDAZOLAM HCL 2 MG/2ML IJ SOLN
INTRAMUSCULAR | Status: AC
  Filled 2025-01-22: qty 2

## 2025-01-22 MED ORDER — CEFAZOLIN SODIUM-DEXTROSE 2-4 GM/100ML-% IV SOLN
2.0000 g | Freq: Four times a day (QID) | INTRAVENOUS | Status: AC
  Administered 2025-01-22 – 2025-01-23 (×2): 2 g via INTRAVENOUS
  Filled 2025-01-22 (×2): qty 100

## 2025-01-22 NOTE — Discharge Instructions (Signed)

## 2025-01-22 NOTE — Anesthesia Procedure Notes (Signed)
 Procedure Name: MAC Date/Time: 01/22/2025 9:30 AM  Performed by: Franchot Delon RAMAN, CRNAPre-anesthesia Checklist: Patient identified, Emergency Drugs available, Suction available and Patient being monitored Oxygen Delivery Method: Simple face mask Ventilation: Oral airway inserted - appropriate to patient size Placement Confirmation: positive ETCO2 Dental Injury: Teeth and Oropharynx as per pre-operative assessment

## 2025-01-22 NOTE — Anesthesia Postprocedure Evaluation (Signed)
"   Anesthesia Post Note  Patient: Meghan Harrison  Procedure(s) Performed: ARTHROPLASTY, KNEE, TOTAL (Left: Knee)     Patient location during evaluation: PACU Anesthesia Type: Spinal Level of consciousness: awake and alert Pain management: pain level controlled Vital Signs Assessment: post-procedure vital signs reviewed and stable Respiratory status: spontaneous breathing, nonlabored ventilation and respiratory function stable Cardiovascular status: blood pressure returned to baseline Postop Assessment: no apparent nausea or vomiting and spinal receding Anesthetic complications: no   No notable events documented.  Last Vitals:  Vitals:   01/22/25 1230 01/22/25 1345  BP: 95/62 92/67  Pulse: 68 62  Resp: 13 10  Temp:    SpO2: 95% 96%    Last Pain:  Vitals:   01/22/25 1230  TempSrc:   PainSc: 0-No pain                 Vertell Row      "

## 2025-01-22 NOTE — Anesthesia Procedure Notes (Signed)
 Anesthesia Regional Block: Adductor canal block   Pre-Anesthetic Checklist: , timeout performed,  Correct Patient, Correct Site, Correct Laterality,  Correct Procedure, Correct Position, site marked,  Risks and benefits discussed,  Pre-op evaluation,  At surgeon's request and post-op pain management  Laterality: Left  Prep: Maximum Sterile Barrier Precautions used, chloraprep       Needles:  Injection technique: Single-shot  Needle Type: Echogenic Stimulator Needle     Needle Length: 9cm  Needle Gauge: 22     Additional Needles:   Procedures:,,,, ultrasound used (permanent image in chart),,    Narrative:  Start time: 01/22/2025 8:53 AM End time: 01/22/2025 8:56 AM Injection made incrementally with aspirations every 5 mL.  Performed by: Personally  Anesthesiologist: Paul Lamarr BRAVO, MD  Additional Notes: Risks, benefits, and alternative discussed. Patient gave consent for procedure. Patient prepped and draped in sterile fashion. Sedation administered, patient remains easily responsive to voice. Relevant anatomy identified with ultrasound guidance. Local anesthetic given in 5cc increments with no signs or symptoms of intravascular injection. No pain or paraesthesias with injection. Patient monitored throughout procedure with no signs of LAST or immediate complications. Tolerated well. Ultrasound image placed in chart.  LANEY Paul, MD

## 2025-01-22 NOTE — Interval H&P Note (Signed)
 History and Physical Interval Note:  01/22/2025 8:31 AM  Meghan Harrison  has presented today for surgery, with the diagnosis of Left kne osteoarthritis.  The various methods of treatment have been discussed with the patient and family. After consideration of risks, benefits and other options for treatment, the patient has consented to  Procedures: ARTHROPLASTY, KNEE, TOTAL (Left) as a surgical intervention.  The patient's history has been reviewed, patient examined, no change in status, stable for surgery.  I have reviewed the patient's chart and labs.  Questions were answered to the patient's satisfaction.     Donnice JONETTA Car

## 2025-01-22 NOTE — Op Note (Signed)
 " NAME:  Meghan Harrison                      MEDICAL RECORD NO.:  [id number]                             FACILITY:  [facility]      PHYSICIAN:  Donnice BIRCH. Ernie, M.D.  DATE OF BIRTH:  [birth year]      DATE OF PROCEDURE:  01/22/2025                                     OPERATIVE REPORT         PREOPERATIVE DIAGNOSIS:  left knee osteoarthritis.      POSTOPERATIVE DIAGNOSIS:  left knee osteoarthritis.      FINDINGS:  The patient was noted to have complete loss of cartilage and   bone-on-bone arthritis with associated osteophytes in the medial and patellofemoral compartments of   the knee with a significant synovitis and associated effusion.  The patient had failed months of conservative treatment including medications, injection therapy, activity modification.     PROCEDURE:  left total knee replacement.      COMPONENTS USED:  DePuy Attune fixed bearing cruciate retaining medial stabilized knee   system, a size 4 femur, 4 tibia, size 8 mm CR MS AOX insert, and 35 anatomic patellar   button.      SURGEON:  Donnice BIRCH. Ernie, M.D.      ASSISTANT:  Rosina Calin, PA-C.      ANESTHESIA:  Regional and Spinal.      SPECIMENS:  None.      COMPLICATION:  None.      DRAINS:  None.  EBL: 150 cc      TOURNIQUET TIME:  tourniquet was not used      The patient was stable to the recovery room.      INDICATION FOR PROCEDURE:  Meghan Harrison is a 69 y.o. female patient of   mine.  The patient had been seen, evaluated, and treated for months conservatively in the   office with medication, activity modification, and injections.  The patient had   radiographic changes of complete loss of joint space with endplate sclerosis and osteophytes noted.  Based on the radiographic changes and failed conservative measures, the patient   decided to proceed with total knee replacement as definitive treatment.  Risks of infection, DVT, component failure, stiffness and the need for revision surgery,  neurovascular injury were reviewed in the office setting.  The postop course was reviewed stressing the efforts to maximize post-operative range of motion, satisfaction and function.  Consent was obtained for benefit of pain   relief.      PROCEDURE IN DETAIL:  The patient was brought to the operative theater.   Once adequate anesthesia, preoperative antibiotics, 2 gm of Ancef ,1 gm of Tranexamic Acid , and 10 mg of Decadron  administered, the patient was positioned supine with bony prominences padded and protected.  The  left lower extremity was prepped and draped in sterile fashion.  A time-   out was performed identifying the patient, planned procedure, and the appropriate extremity.      The left lower extremity was placed in the [facility] leg holder.  A midline incision was   made followed by median parapatellar arthrotomy.  Following initial   exposure,  attention was first directed to the patella.  Precut   measurement was noted to be 22 mm.  I resected down to 13 mm and used a   35 anatomic patellar button to restore patellar height as well as cover the cut surface.  Limited lateral facetecomy was performed.     The lug holes were drilled and a metal shim was placed to protect the   patella from retractors and saw blade during the procedure.      At this point, attention was now directed to the femur.  The femoral   canal was opened with a drill, irrigated to try to prevent fat emboli.  An   intramedullary rod was passed at 3 degrees valgus, 8 mm of bone was   resected off the distal femur.  Following this resection, the tibia was   subluxated anteriorly.  Using the extramedullary guide, 2 mm of bone was resected off   the proximal medial tibia.  We confirmed the gap would be   stable medially and laterally with a size 6 spacer block as well as confirmed that the tibial cut was perpendicular in the coronal plane, checking with an alignment rod.      Once this was done, I sized the femur to  be a size 4 in the anterior-   posterior dimension and chose a standard component based on medial and   lateral dimension.  The size 4 rotation block was then pinned in   position anterior referenced using the C-clamp to set rotation.  The   anterior, posterior, and  chamfer cuts were made without difficulty nor   notching making certain that I was along the anterior cortex to help   with flexion gap stability.      The final femoral shim cut was made off the lateral aspect of distal femur.      At this point, the tibia was sized to be a size 4.  The size 4 tray was   then pinned in position through the medial third of the tubercle,   drilled, and keel punched.  Trial reduction was now carried with a 4 femur,  4 tibia, a size 8 mm CR insert, and the 35 anatomic patella botton.  The knee was brought to full extension with good flexion stability with the patella tracking through the trochlea without application of pressure.  Given   all these findings the trial components removed.  Final components were   opened and cement was mixed.  The knee was irrigated with normal saline solution and pulse lavage.  The posterior synovial capsule was then injected with 30 cc of 0.25% Marcaine  with epinephrine , 1 cc of Toradol  and 30 cc of NS for a total of 61 cc.     Final implants were then cemented onto cleaned and dried cut surfaces of bone with the knee brought to extension with a size 8 mm CR trial insert.      Once the cement had fully cured, excessive cement was removed   throughout the knee.  I confirmed that I was satisfied with the range of   motion and stability, and the final size 8 mm CR MS AOX insert was chosen and it was impacted into the tibial tray.     At this point in the case no significant   hemostasis was required.  The extensor mechanism was then reapproximated using #1 Vicryl and #1 Stratafix sutures with the knee   in flexion.  The  remaining wound was closed with 2-0 Vicryl and  running 4-0 Monocryl.   The knee was cleaned, dried, dressed sterilely using Dermabond and   Aquacel dressing.  The patient was then   brought to recovery room in stable condition, tolerating the procedure   well.   Please note that PA Patti was present for the entirety of the case, and was utilized for pre-operative positioning, peri-operative retractor management, general facilitation of the procedure and for primary wound closure at the end of the case.              Donnice CORDOBA Ernie, M.D.    01/22/2025 10:39 AM "

## 2025-01-23 ENCOUNTER — Other Ambulatory Visit (HOSPITAL_COMMUNITY): Payer: Self-pay

## 2025-01-23 LAB — BASIC METABOLIC PANEL WITH GFR
Anion gap: 6 (ref 5–15)
BUN: 13 mg/dL (ref 8–23)
CO2: 28 mmol/L (ref 22–32)
Calcium: 8.5 mg/dL — ABNORMAL LOW (ref 8.9–10.3)
Chloride: 106 mmol/L (ref 98–111)
Creatinine, Ser: 0.64 mg/dL (ref 0.44–1.00)
GFR, Estimated: >60 mL/min
Glucose, Bld: 94 mg/dL (ref 70–99)
Potassium: 4.2 mmol/L (ref 3.5–5.1)
Sodium: 140 mmol/L (ref 135–145)

## 2025-01-23 LAB — CBC
HCT: 31.2 % — ABNORMAL LOW (ref 36.0–46.0)
Hemoglobin: 9.9 g/dL — ABNORMAL LOW (ref 12.0–15.0)
MCH: 32.7 pg (ref 26.0–34.0)
MCHC: 31.7 g/dL (ref 30.0–36.0)
MCV: 103 fL — ABNORMAL HIGH (ref 80.0–100.0)
Platelets: 241 10*3/uL (ref 150–400)
RBC: 3.03 MIL/uL — ABNORMAL LOW (ref 3.87–5.11)
RDW: 12.2 % (ref 11.5–15.5)
WBC: 9.6 10*3/uL (ref 4.0–10.5)
nRBC: 0 % (ref 0.0–0.2)

## 2025-01-23 MED ORDER — METHOCARBAMOL 500 MG PO TABS
500.0000 mg | ORAL_TABLET | Freq: Four times a day (QID) | ORAL | 0 refills | Status: AC | PRN
  Filled 2025-01-23: qty 40, 10d supply, fill #0

## 2025-01-23 MED ORDER — ASPIRIN 81 MG PO CHEW
81.0000 mg | CHEWABLE_TABLET | Freq: Two times a day (BID) | ORAL | 0 refills | Status: AC
  Filled 2025-01-23: qty 56, 28d supply, fill #0

## 2025-01-23 MED ORDER — CELECOXIB 200 MG PO CAPS
200.0000 mg | ORAL_CAPSULE | Freq: Two times a day (BID) | ORAL | 0 refills | Status: AC
  Filled 2025-01-23: qty 60, 30d supply, fill #0

## 2025-01-23 MED ORDER — SENNA 8.6 MG PO TABS
2.0000 | ORAL_TABLET | Freq: Every day | ORAL | 0 refills | Status: AC
  Filled 2025-01-23: qty 28, 14d supply, fill #0

## 2025-01-23 MED ORDER — HYDROCODONE-ACETAMINOPHEN 5-325 MG PO TABS
1.0000 | ORAL_TABLET | ORAL | 0 refills | Status: AC | PRN
  Filled 2025-01-23: qty 42, 7d supply, fill #0

## 2025-01-23 MED ORDER — POLYETHYLENE GLYCOL 3350 17 GM/SCOOP PO POWD
17.0000 g | Freq: Two times a day (BID) | ORAL | 0 refills | Status: AC
  Filled 2025-01-23: qty 238, 7d supply, fill #0

## 2025-01-23 MED ORDER — MUPIROCIN 2 % EX OINT
1.0000 | TOPICAL_OINTMENT | Freq: Two times a day (BID) | CUTANEOUS | Status: DC
  Administered 2025-01-23: 1 via NASAL
  Filled 2025-01-23: qty 22

## 2025-01-23 MED ORDER — KETOROLAC TROMETHAMINE 15 MG/ML IJ SOLN
7.5000 mg | Freq: Four times a day (QID) | INTRAMUSCULAR | Status: DC
  Administered 2025-01-23: 7.5 mg via INTRAVENOUS
  Filled 2025-01-23: qty 1

## 2025-01-23 NOTE — Progress Notes (Signed)
" ° °  Subjective: 1 Day Post-Op Procedures (LRB): ARTHROPLASTY, KNEE, TOTAL (Left) Patient reports pain as mild.   Patient seen in rounds for Dr. Ernie. Patient is well, and has had no acute complaints or problems. No acute events overnight. Foley catheter removed. Patient ambulated 50 feet with PT. Her hip has been bothering her most.  We will start therapy today.   Objective: Vital signs in last 24 hours: Temp:  [97.6 F (36.4 C)-98.4 F (36.9 C)] 98.1 F (36.7 C) (02/04 0605) Pulse Rate:  [62-89] 72 (02/04 0605) Resp:  [10-18] 16 (02/04 0605) BP: (80-117)/(50-81) 110/72 (02/04 0605) SpO2:  [94 %-100 %] 100 % (02/04 0605) Weight:  [72.9 kg] 72.9 kg (02/03 1634)  Intake/Output from previous day:  Intake/Output Summary (Last 24 hours) at 01/23/2025 0812 Last data filed at 01/23/2025 0612 Gross per 24 hour  Intake 2811.47 ml  Output 1400 ml  Net 1411.47 ml     Intake/Output this shift: No intake/output data recorded.  Labs: Recent Labs    01/23/25 0328  HGB 9.9*   Recent Labs    01/23/25 0328  WBC 9.6  RBC 3.03*  HCT 31.2*  PLT 241   Recent Labs    01/23/25 0328  NA 140  K 4.2  CL 106  CO2 28  BUN 13  CREATININE 0.64  GLUCOSE 94  CALCIUM  8.5*   No results for input(s): LABPT, INR in the last 72 hours.  Exam: General - Patient is Alert and Oriented Extremity - Neurologically intact Sensation intact distally Intact pulses distally Dorsiflexion/Plantar flexion intact Dressing - dressing C/D/I Motor Function - intact, moving foot and toes well on exam.   Past Medical History:  Diagnosis Date   Arthritis    GERD (gastroesophageal reflux disease)    Hyperlipidemia    Pneumonia     Assessment/Plan: 1 Day Post-Op Procedures (LRB): ARTHROPLASTY, KNEE, TOTAL (Left) Principal Problem:   S/P total knee arthroplasty, left  Estimated body mass index is 28.47 kg/m as calculated from the following:   Height as of this encounter: 5' 3 (1.6 m).    Weight as of this encounter: 72.9 kg. Advance diet Up with therapy D/C IV fluids   Patient's anticipated LOS is less than 2 midnights, meeting these requirements: - Younger than 51 - Lives within 1 hour of care - Has a competent adult at home to recover with post-op recover - NO history of  - Chronic pain requiring opiods  - Diabetes  - Coronary Artery Disease  - Heart failure  - Heart attack  - Stroke  - DVT/VTE  - Cardiac arrhythmia  - Respiratory Failure/COPD  - Renal failure  - Anemia  - Advanced Liver disease     DVT Prophylaxis - Aspirin  Weight bearing as tolerated.  Hgb stable at 9.9 this AM BP soft - gave 500 cc bolus  Plan is to go Home after hospital stay. Plan for discharge today following 1-2 sessions of PT as long as they are meeting their goals.  OPPT has not been arranged yet - we will reach out to [facility] today   Follow up in the office in 2 weeks.   Rosina Calin, PA-C Orthopedic Surgery [phone] 01/23/2025, 8:12 AM  "

## 2025-01-23 NOTE — Progress Notes (Signed)
 Physical Therapy Treatment Patient Details Name: Meghan Harrison MRN: [id number] DOB: 02-Aug-1956 Today's Date: 01/23/2025   History of Present Illness 69 yo female presents to therapy s/p L TKA on 01/22/2025 due to failure of conservative measures. 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    PT Comments  Pt is progressing very well this session, pain controlled, motivated to work with PT. Will see again in pm and pt should be ready to d/c later today.    If plan is discharge home, recommend the following: A little help with walking and/or transfers;A little help with bathing/dressing/bathroom;Assistance with cooking/housework;Assist for transportation;Help with stairs or ramp for entrance   Can travel by private vehicle        Equipment Recommendations  None recommended by PT    Recommendations for Other Services       Precautions / Restrictions Precautions Precautions: Fall;Knee Restrictions Weight Bearing Restrictions Per Provider Order: No     Mobility  Bed Mobility Overal bed mobility: Needs Assistance Bed Mobility: Supine to Sit     Supine to sit: Supervision          Transfers Overall transfer level: Needs assistance Equipment used: Rolling walker (2 wheels) Transfers: Sit to/from Stand Sit to Stand: Supervision           General transfer comment: cues for hand placement    Ambulation/Gait Ambulation/Gait assistance: Contact guard assist, Supervision Gait Distance (Feet): 80 Feet Assistive device: Rolling walker (2 wheels) Gait Pattern/deviations: Step-to pattern, Decreased stance time - left Gait velocity: decreased     General Gait Details: slight L knee buckling with step through pattern, cues to return to step to gait for safety. no further buckling   Stairs             Wheelchair Mobility     Tilt Bed    Modified Rankin (Stroke  Patients Only)       Balance Overall balance assessment: Needs assistance Sitting-balance support: Feet supported Sitting balance-Leahy Scale: Good     Standing balance support: Bilateral upper extremity supported, During functional activity, Reliant on assistive device for balance Standing balance-Leahy Scale: Fair Standing balance comment: static standing no UE support                            Communication Communication Communication: No apparent difficulties  Cognition Arousal: Alert Behavior During Therapy: WFL for tasks assessed/performed   PT - Cognitive impairments: No apparent impairments                         Following commands: Intact      Cueing    Exercises Total Joint Exercises Ankle Circles/Pumps: AROM, Both, 10 reps Quad Sets: AROM, Both, 10 reps Heel Slides: AAROM, Left, 10 reps Goniometric ROM: grossly -5 to 90* knee flexion    General Comments        Pertinent Vitals/Pain Pain Assessment Pain Assessment: 0-10 Pain Score: 4  Pain Location: L knee and LE Pain Descriptors / Indicators: Sore Pain Intervention(s): Limited activity within patient's tolerance, Monitored during session, Premedicated before session, Repositioned, Ice applied    Home Living                          Prior Function  PT Goals (current goals can now be found in the care plan section) Acute Rehab PT Goals Patient Stated Goal: good recovery and get back to doing everything I was before PT Goal Formulation: With patient Time For Goal Achievement: 02/05/25 Potential to Achieve Goals: Good Progress towards PT goals: Progressing toward goals    Frequency    7X/week      PT Plan      Co-evaluation              AM-PAC PT 6 Clicks Mobility   Outcome Measure  Help needed turning from your back to your side while in a flat bed without using bedrails?: None Help needed moving from lying on your back to sitting  on the side of a flat bed without using bedrails?: None Help needed moving to and from a bed to a chair (including a wheelchair)?: A Little Help needed standing up from a chair using your arms (e.g., wheelchair or bedside chair)?: A Little Help needed to walk in hospital room?: A Little Help needed climbing 3-5 steps with a railing? : A Little 6 Click Score: 20    End of Session Equipment Utilized During Treatment: Gait belt Activity Tolerance: Patient tolerated treatment well;No increased pain Patient left: in chair;with call bell/phone within reach;with chair alarm set Nurse Communication: Mobility status PT Visit Diagnosis: Unsteadiness on feet (R26.81);Other abnormalities of gait and mobility (R26.89);Muscle weakness (generalized) (M62.81);Difficulty in walking, not elsewhere classified (R26.2);Pain Pain - Right/Left: Left Pain - part of body: Knee;Leg     Time: 1010-1034 PT Time Calculation (min) (ACUTE ONLY): 24 min  Charges:    $Gait Training: 8-22 mins $Therapeutic Exercise: 8-22 mins PT General Charges $$ ACUTE PT VISIT: 1 Visit                     Adrean Findlay, PT  Acute Rehab Dept [facility]) [phone]  01/23/2025    [facility] 01/23/2025, 12:16 PM

## 2025-01-23 NOTE — TOC Transition Note (Signed)
 Transition of Care [facility]) - Discharge Note   Patient Details  Name: Meghan Harrison MRN: [id number] Date of Birth: [birth year]  Transition of Care [facility]) CM/SW Contact:  NORMAN ASPEN, LCSW Phone Number: 01/23/2025, 9:27 AM   Clinical Narrative:     Met with pt who confirms she has needed DME in the home.  OPPT already arranged with [facility]).  Able to secure PCP appointment on 02/13/25 @ 11:20am.  No further IP CM needs.  Final next level of care: OP Rehab Barriers to Discharge: No Barriers Identified   Patient Goals and CMS Choice Patient states their goals for this hospitalization and ongoing recovery are:: return home          Discharge Placement                       Discharge Plan and Services Additional resources added to the After Visit Summary for                  DME Arranged: N/A DME Agency: NA                  Social Drivers of Health (SDOH) Interventions SDOH Screenings   Food Insecurity: No Food Insecurity (01/22/2025)  Housing: Low Risk (01/22/2025)  Transportation Needs: No Transportation Needs (01/22/2025)  Utilities: Not At Risk (01/22/2025)  Financial Resource Strain: Low Risk (09/07/2022)   Received from [facility]  Physical Activity: Insufficiently Active (09/07/2022)   Received from [facility] visits prior to 02/19/2023., [facility]  Social Connections: Socially Integrated (01/22/2025)  Stress: No Stress Concern Present (09/07/2022)   Received from [facility] visits prior to 02/19/2023., [facility]  Tobacco Use: Low Risk (01/22/2025)     Readmission Risk Interventions     No data to display

## 2025-01-23 NOTE — Progress Notes (Signed)
 Discharge medications delivered to patient at the bedside in a secure bag.

## 2025-01-23 NOTE — Progress Notes (Signed)
 "  01/23/25 1300  PT Visit Information  Last PT Received On 01/23/25  Assistance Needed Pt making steady progress, meeting PT goals and is ready to d/c home with spouse assisting prn.  Reviewed gait, stairs this afternoon, TKA HEP reviewed in am. Pt reports more soreness this afternoon, medicated just prior to PT session. Anticipate pt will continue to progress well in OP setting  History of Present Illness 69 yo female presents to therapy s/p L TKA on 01/22/2025 due to failure of conservative measures. 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  Subjective Data  Patient Stated Goal good recovery and get back to doing everything I was before  Precautions  Precautions Fall;Knee  Restrictions  Weight Bearing Restrictions Per Provider Order No  Pain Assessment  Pain Assessment 0-10  Pain Score 5  Pain Location L knee and LE  Pain Descriptors / Indicators Sore  Pain Intervention(s) Limited activity within patient's tolerance;Premedicated before session;Monitored during session;Repositioned (medicated just prior to session)  Cognition  Arousal Alert  Behavior During Therapy [facility] for tasks assessed/performed  PT - Cognitive impairments No apparent impairments  Following Commands  Following commands Intact  Communication  Communication No apparent difficulties  Bed Mobility  Overal bed mobility Needs Assistance  Bed Mobility Supine to Sit;Sit to Supine  Supine to sit Supervision  Transfers  Overall transfer level Needs assistance  Equipment used Rolling walker (2 wheels)  Transfers Sit to/from Stand  Sit to Stand Supervision  General transfer comment cues for hand placement  Ambulation/Gait  Ambulation/Gait assistance Contact guard assist;Supervision  Gait Distance (Feet) 100 Feet  Assistive device Rolling walker (2 wheels)  Gait Pattern/deviations Step-to pattern;Decreased stance time - left   General Gait Details beginning step through with no knee buckling this session. cues to return to step to gait if needed for stability and  safety.  Gait velocity decreased  Balance  Overall balance assessment Needs assistance  Sitting-balance support Feet supported  Sitting balance-Leahy Scale Good  Standing balance support Bilateral upper extremity supported;During functional activity;Reliant on assistive device for balance  Standing balance-Leahy Scale Fair  Standing balance comment static standing no UE support  Exercises  Exercises Total Joint  PT - End of Session  Equipment Utilized During Treatment Gait belt  Activity Tolerance Patient tolerated treatment well;No increased pain  Patient left in chair;with call bell/phone within reach;with chair alarm set  Nurse Communication Mobility status   PT - Assessment/Plan  PT Visit Diagnosis Unsteadiness on feet (R26.81);Other abnormalities of gait and mobility (R26.89);Muscle weakness (generalized) (M62.81);Difficulty in walking, not elsewhere classified (R26.2);Pain  Pain - Right/Left Left  Pain - part of body Knee;Leg  PT Frequency (ACUTE ONLY) 7X/week  Follow Up Recommendations Follow physician's recommendations for discharge plan and follow up therapies  Patient can return home with the following A little help with walking and/or transfers;A little help with bathing/dressing/bathroom;Assistance with cooking/housework;Assist for transportation;Help with stairs or ramp for entrance  PT equipment None recommended by PT  AM-PAC PT 6 Clicks Mobility Outcome Measure (Version 2)  Help needed turning from your back to your side while in a flat bed without using bedrails? 4  Help needed moving from lying on your back to sitting on the side of a flat bed without using bedrails? 4  Help needed moving to and from a bed to a chair (including a wheelchair)? 3  Help needed standing up from a  chair using your arms (e.g., wheelchair or bedside  chair)? 3  Help needed to walk in hospital room? 3  Help needed climbing 3-5 steps with a railing?  3  6 Click Score 20  Consider Recommendation of Discharge To: Home with no services  Progressive Mobility  What is the highest level of mobility based on the mobility assessment? Level 4 (Ambulates with assistance) - Balance while stepping forward/back - Complete  PT Goal Progression  Progress towards PT goals Progressing toward goals  Acute Rehab PT Goals  PT Goal Formulation With patient  Time For Goal Achievement 02/05/25  Potential to Achieve Goals Good  PT Time Calculation  PT Start Time (ACUTE ONLY) 1245  PT Stop Time (ACUTE ONLY) 1306  PT Time Calculation (min) (ACUTE ONLY) 21 min  PT General Charges  $$ ACUTE PT VISIT 1 Visit  PT Treatments  $Gait Training 8-22 mins   "

## 2025-01-23 NOTE — Care Management Obs Status (Signed)
 MEDICARE OBSERVATION STATUS NOTIFICATION   Patient Details  Name: Meghan Harrison MRN: [id number] Date of Birth: June 06, 1956   Medicare Observation Status Notification Given:  Yes    NORMAN ASPEN, LCSW 01/23/2025, 1:05 PM

## 2025-01-24 ENCOUNTER — Encounter (HOSPITAL_COMMUNITY): Payer: Self-pay | Admitting: Orthopedic Surgery
